# Patient Record
Sex: Male | Born: 1955 | Race: White | Hispanic: No | Marital: Married | Smoking: Never smoker
Health system: Southern US, Community
[De-identification: ages and names within clinical notes are randomized; demographics above are authoritative.]

## PROBLEM LIST (undated history)

## (undated) DIAGNOSIS — I10 Essential (primary) hypertension: Secondary | ICD-10-CM

## (undated) DIAGNOSIS — I639 Cerebral infarction, unspecified: Secondary | ICD-10-CM

## (undated) DIAGNOSIS — J129 Viral pneumonia, unspecified: Secondary | ICD-10-CM

## (undated) DIAGNOSIS — E119 Type 2 diabetes mellitus without complications: Secondary | ICD-10-CM

## (undated) DIAGNOSIS — I509 Heart failure, unspecified: Secondary | ICD-10-CM

## (undated) DIAGNOSIS — J8410 Pulmonary fibrosis, unspecified: Secondary | ICD-10-CM

## (undated) HISTORY — PX: UMBILICAL HERNIA REPAIR: SHX196

## (undated) HISTORY — DX: Heart failure, unspecified: I50.9

## (undated) HISTORY — PX: THORACIC FUSION: SHX1062

## (undated) HISTORY — DX: Essential (primary) hypertension: I10

## (undated) HISTORY — PX: CERVICAL FUSION: SHX112

## (undated) HISTORY — DX: Viral pneumonia, unspecified: J12.9

## (undated) HISTORY — PX: HERNIA REPAIR: SHX51

## (undated) HISTORY — PX: ROUX-EN-Y GASTRIC BYPASS: SHX1104

## (undated) HISTORY — DX: Cerebral infarction, unspecified: I63.9

## (undated) HISTORY — PX: OTHER SURGICAL HISTORY: SHX169

## (undated) HISTORY — PX: ROTATOR CUFF REPAIR: SHX139

## (undated) HISTORY — DX: Pulmonary fibrosis, unspecified: J84.10

## (undated) HISTORY — DX: Type 2 diabetes mellitus without complications: E11.9

---

## 2021-10-31 ENCOUNTER — Encounter (HOSPITAL_COMMUNITY): Payer: Self-pay | Admitting: Emergency Medicine

## 2021-10-31 ENCOUNTER — Other Ambulatory Visit: Payer: Self-pay

## 2021-10-31 ENCOUNTER — Other Ambulatory Visit (HOSPITAL_COMMUNITY): Payer: Self-pay

## 2021-10-31 ENCOUNTER — Ambulatory Visit (HOSPITAL_COMMUNITY)
Admission: EM | Admit: 2021-10-31 | Discharge: 2021-10-31 | Disposition: A | Discharge status: Home / Self Care | Payer: No Typology Code available for payment source | Attending: Internal Medicine | Admitting: Internal Medicine

## 2021-10-31 DIAGNOSIS — H6592 Unspecified nonsuppurative otitis media, left ear: Secondary | ICD-10-CM

## 2021-10-31 MED ORDER — OLOPATADINE HCL 0.1 % OP SOLN
1.0000 [drp] | Freq: Two times a day (BID) | OPHTHALMIC | 12 refills | Status: DC
  Filled 2021-10-31: qty 5, 25d supply, fill #0

## 2021-10-31 MED ORDER — AMOXICILLIN-POT CLAVULANATE 875-125 MG PO TABS
1.0000 | ORAL_TABLET | Freq: Two times a day (BID) | ORAL | 0 refills | Status: DC
  Filled 2021-10-31: qty 14, 7d supply, fill #0

## 2021-10-31 NOTE — ED Triage Notes (Signed)
Pt reports left ear drainage and pain x 10 days and  bilateral eye irritation. Pt states the left ear was swollen shut and he believes he has an ear infection. Pt also reports his eyes have been red at night and extremely itchy.

## 2021-10-31 NOTE — Discharge Instructions (Addendum)
Please take medications as prescribed Maintain adequate hydration If you have worsening symptoms please return to the urgent care to be reevaluated.

## 2021-10-31 NOTE — ED Provider Notes (Signed)
[facility]    CSN: [id number] Arrival date & time: 10/31/21  1627      History   Chief Complaint Chief Complaint  Patient presents with   Ear Drainage    HPI Steven Brandt is a 66 y.o. male comes to urgent care with left ear drainage and pain of 10 days duration.  Patient's symptoms have been persistent.  He also complains of itchy eyes with the matting of the eyelids every morning.  Symptoms have been ongoing for the past several days as well.  No fever or chills.  No dizziness, spinning sensation, nausea or vomiting.  No diarrhea.  Patient uses hearing aids.  Patient denies any light sensitivity.  No history of seasonal allergies.  Patient recently relocated here from [place].  HPI  History reviewed. No pertinent past medical history.  There are no problems to display for this patient.   History reviewed. No pertinent surgical history.     Home Medications    Prior to Admission medications   Medication Sig Start Date End Date Taking? Authorizing Provider  amoxicillin-clavulanate (AUGMENTIN) 875-125 MG tablet Take 1 tablet by mouth every 12 (twelve) hours. 10/31/21  Yes Avaleen Brownley, Britta Mccreedy, MD  olopatadine (PATANOL) 0.1 % ophthalmic solution Place 1 drop into both eyes 2 (two) times daily. 10/31/21  Yes Lorree Millar, Britta Mccreedy, MD    Family History History reviewed. No pertinent family history.  Social History Social History   Tobacco Use   Smoking status: Never   Smokeless tobacco: Never     Allergies   Patient has no allergy information on record.   Review of Systems Review of Systems  Constitutional: Negative.   HENT:  Positive for ear discharge and ear pain. Negative for postnasal drip, rhinorrhea, sinus pressure and sore throat.   Eyes:  Positive for discharge and redness. Negative for pain.  Respiratory: Negative.    Cardiovascular: Negative.   Neurological: Negative.     Physical Exam Triage Vital Signs ED Triage Vitals  Enc Vitals  Group     BP 10/31/21 1717 128/78     Pulse Rate 10/31/21 1717 79     Resp 10/31/21 1717 17     Temp 10/31/21 1717 98 F (36.7 C)     Temp Source 10/31/21 1717 Oral     SpO2 10/31/21 1717 98 %     Weight 10/31/21 1715 180 lb (81.6 kg)     Height 10/31/21 1715 5\' 7"  (1.702 m)     Head Circumference --      Peak Flow --      Pain Score 10/31/21 1715 5     Pain Loc --      Pain Edu? --      Excl. in GC? --    No data found.  Updated Vital Signs BP 128/78 (BP Location: Right Arm)    Pulse 79    Temp 98 F (36.7 C) (Oral)    Resp 17    Ht 5\' 7"  (1.702 m)    Wt 81.6 kg    SpO2 98%    BMI 28.19 kg/m   Visual Acuity Right Eye Distance:   Left Eye Distance:   Bilateral Distance:    Right Eye Near:   Left Eye Near:    Bilateral Near:     Physical Exam Vitals and nursing note reviewed.  Constitutional:      General: He is not in acute distress.    Appearance: He is not ill-appearing.  HENT:  Right Ear: Tympanic membrane normal.     Left Ear: Ear canal normal. There is no impacted cerumen.     Ears:     Comments: Left tympanic membrane is erythematous with purulent middle ear effusion. Cardiovascular:     Rate and Rhythm: Normal rate and regular rhythm.  Pulmonary:     Effort: Pulmonary effort is normal.     Breath sounds: Normal breath sounds.  Abdominal:     General: Bowel sounds are normal.     Palpations: Abdomen is soft.  Neurological:     Mental Status: He is alert.     UC Treatments / Results  Labs (all labs ordered are listed, but only abnormal results are displayed) Labs Reviewed - No data to display  EKG   Radiology No results found.  Procedures Procedures (including critical care time)  Medications Ordered in UC Medications - No data to display  Initial Impression / Assessment and Plan / UC Course  I have reviewed the triage vital signs and the nursing notes.  Pertinent labs & imaging results that were available during my care of the  patient were reviewed by me and considered in my medical decision making (see chart for details).     1.  Left otitis media with effusion: Amoxicillin twice daily for 7 days Ibuprofen or Tylenol as needed for pain Patanol eyedrops as needed for eye itching Use a wet rag to clean eyelids if they are matted together. Return to urgent care if symptoms persist or worsens. Final Clinical Impressions(s) / UC Diagnoses   Final diagnoses:  Left otitis media with effusion     Discharge Instructions      Please take medications as prescribed Maintain adequate hydration If you have worsening symptoms please return to the urgent care to be reevaluated.   ED Prescriptions     Medication Sig Dispense Auth. Provider   olopatadine (PATANOL) 0.1 % ophthalmic solution Place 1 drop into both eyes 2 (two) times daily. 5 mL Merrilee Jansky, MD   amoxicillin-clavulanate (AUGMENTIN) 875-125 MG tablet Take 1 tablet by mouth every 12 (twelve) hours. 14 tablet Johany Hansman, Britta Mccreedy, MD      PDMP not reviewed this encounter.   Merrilee Jansky, MD 10/31/21 [phone]

## 2021-12-03 ENCOUNTER — Emergency Department: Payer: No Typology Code available for payment source

## 2021-12-03 ENCOUNTER — Inpatient Hospital Stay
Admission: EM | Admit: 2021-12-03 | Discharge: 2021-12-05 | DRG: 287 | Disposition: A | Discharge status: Home / Self Care | Payer: No Typology Code available for payment source | Attending: Internal Medicine | Admitting: Internal Medicine

## 2021-12-03 ENCOUNTER — Other Ambulatory Visit: Payer: Self-pay

## 2021-12-03 ENCOUNTER — Observation Stay
Admit: 2021-12-03 | Discharge: 2021-12-03 | Disposition: A | Discharge status: Home / Self Care | Payer: No Typology Code available for payment source

## 2021-12-03 ENCOUNTER — Encounter: Payer: Self-pay | Admitting: Obstetrics and Gynecology

## 2021-12-03 DIAGNOSIS — R739 Hyperglycemia, unspecified: Secondary | ICD-10-CM

## 2021-12-03 DIAGNOSIS — Z79899 Other long term (current) drug therapy: Secondary | ICD-10-CM

## 2021-12-03 DIAGNOSIS — R079 Chest pain, unspecified: Secondary | ICD-10-CM | POA: Diagnosis not present

## 2021-12-03 DIAGNOSIS — F32A Depression, unspecified: Secondary | ICD-10-CM | POA: Insufficient documentation

## 2021-12-03 DIAGNOSIS — F1020 Alcohol dependence, uncomplicated: Secondary | ICD-10-CM

## 2021-12-03 DIAGNOSIS — J849 Interstitial pulmonary disease, unspecified: Secondary | ICD-10-CM

## 2021-12-03 DIAGNOSIS — H9190 Unspecified hearing loss, unspecified ear: Secondary | ICD-10-CM | POA: Insufficient documentation

## 2021-12-03 DIAGNOSIS — K219 Gastro-esophageal reflux disease without esophagitis: Secondary | ICD-10-CM | POA: Diagnosis present

## 2021-12-03 DIAGNOSIS — I10 Essential (primary) hypertension: Secondary | ICD-10-CM | POA: Diagnosis present

## 2021-12-03 DIAGNOSIS — R072 Precordial pain: Secondary | ICD-10-CM | POA: Diagnosis not present

## 2021-12-03 DIAGNOSIS — I429 Cardiomyopathy, unspecified: Secondary | ICD-10-CM

## 2021-12-03 DIAGNOSIS — F909 Attention-deficit hyperactivity disorder, unspecified type: Secondary | ICD-10-CM | POA: Diagnosis present

## 2021-12-03 DIAGNOSIS — M47812 Spondylosis without myelopathy or radiculopathy, cervical region: Secondary | ICD-10-CM | POA: Insufficient documentation

## 2021-12-03 DIAGNOSIS — I5022 Chronic systolic (congestive) heart failure: Secondary | ICD-10-CM | POA: Diagnosis present

## 2021-12-03 DIAGNOSIS — I428 Other cardiomyopathies: Secondary | ICD-10-CM | POA: Diagnosis present

## 2021-12-03 DIAGNOSIS — F329 Major depressive disorder, single episode, unspecified: Secondary | ICD-10-CM | POA: Diagnosis present

## 2021-12-03 DIAGNOSIS — Z981 Arthrodesis status: Secondary | ICD-10-CM

## 2021-12-03 DIAGNOSIS — R42 Dizziness and giddiness: Secondary | ICD-10-CM

## 2021-12-03 DIAGNOSIS — I951 Orthostatic hypotension: Secondary | ICD-10-CM | POA: Diagnosis present

## 2021-12-03 DIAGNOSIS — F17200 Nicotine dependence, unspecified, uncomplicated: Secondary | ICD-10-CM | POA: Diagnosis present

## 2021-12-03 DIAGNOSIS — E1165 Type 2 diabetes mellitus with hyperglycemia: Secondary | ICD-10-CM | POA: Diagnosis present

## 2021-12-03 DIAGNOSIS — E119 Type 2 diabetes mellitus without complications: Secondary | ICD-10-CM

## 2021-12-03 DIAGNOSIS — F4310 Post-traumatic stress disorder, unspecified: Secondary | ICD-10-CM | POA: Diagnosis present

## 2021-12-03 DIAGNOSIS — Z9884 Bariatric surgery status: Secondary | ICD-10-CM

## 2021-12-03 DIAGNOSIS — G8929 Other chronic pain: Secondary | ICD-10-CM | POA: Insufficient documentation

## 2021-12-03 DIAGNOSIS — I110 Hypertensive heart disease with heart failure: Secondary | ICD-10-CM | POA: Diagnosis present

## 2021-12-03 DIAGNOSIS — Z20822 Contact with and (suspected) exposure to covid-19: Secondary | ICD-10-CM | POA: Diagnosis present

## 2021-12-03 LAB — LIPID PANEL
Cholesterol: 192 mg/dL (ref 0–200)
HDL: 39 mg/dL — ABNORMAL LOW (ref >40)
LDL Cholesterol: 124 mg/dL — ABNORMAL HIGH (ref 0–99)
Total CHOL/HDL Ratio: 4.9 RATIO
Triglycerides: 146 mg/dL (ref <150)
VLDL: 29 mg/dL (ref 0–40)

## 2021-12-03 LAB — CBC
HCT: 45.3 % (ref 39.0–52.0)
Hemoglobin: 15.2 g/dL (ref 13.0–17.0)
MCH: 32.6 pg (ref 26.0–34.0)
MCHC: 33.6 g/dL (ref 30.0–36.0)
MCV: 97.2 fL (ref 80.0–100.0)
Platelets: 194 10*3/uL (ref 150–400)
RBC: 4.66 MIL/uL (ref 4.22–5.81)
RDW: 12.6 % (ref 11.5–15.5)
WBC: 7.2 10*3/uL (ref 4.0–10.5)
nRBC: 0 % (ref 0.0–0.2)

## 2021-12-03 LAB — TROPONIN I (HIGH SENSITIVITY)
Troponin I (High Sensitivity): 8 ng/L (ref <18)
Troponin I (High Sensitivity): 8 ng/L (ref <18)

## 2021-12-03 LAB — RESP PANEL BY RT-PCR (FLU A&B, COVID) ARPGX2
Influenza A by PCR: NEGATIVE
Influenza B by PCR: NEGATIVE
SARS Coronavirus 2 by RT PCR: NEGATIVE

## 2021-12-03 LAB — BASIC METABOLIC PANEL
Anion gap: 10 (ref 5–15)
BUN: 15 mg/dL (ref 8–23)
CO2: 26 mmol/L (ref 22–32)
Calcium: 9.1 mg/dL (ref 8.9–10.3)
Chloride: 100 mmol/L (ref 98–111)
Creatinine, Ser: 1.12 mg/dL (ref 0.61–1.24)
GFR, Estimated: >60 mL/min (ref >60)
Glucose, Bld: 196 mg/dL — ABNORMAL HIGH (ref 70–99)
Potassium: 3.8 mmol/L (ref 3.5–5.1)
Sodium: 136 mmol/L (ref 135–145)

## 2021-12-03 LAB — HEPATIC FUNCTION PANEL
ALT: 26 U/L (ref 0–44)
AST: 21 U/L (ref 15–41)
Albumin: 3.9 g/dL (ref 3.5–5.0)
Alkaline Phosphatase: 82 U/L (ref 38–126)
Bilirubin, Direct: 0.1 mg/dL (ref 0.0–0.2)
Indirect Bilirubin: 0.8 mg/dL (ref 0.3–0.9)
Total Bilirubin: 0.9 mg/dL (ref 0.3–1.2)
Total Protein: 6.8 g/dL (ref 6.5–8.1)

## 2021-12-03 LAB — HEPARIN LEVEL (UNFRACTIONATED): Heparin Unfractionated: 0.24 IU/mL — ABNORMAL LOW (ref 0.30–0.70)

## 2021-12-03 LAB — BRAIN NATRIURETIC PEPTIDE: B Natriuretic Peptide: 19.1 pg/mL (ref 0.0–100.0)

## 2021-12-03 LAB — PROTIME-INR
INR: 1.1 (ref 0.8–1.2)
Prothrombin Time: 13.8 seconds (ref 11.4–15.2)

## 2021-12-03 LAB — APTT: aPTT: 32 seconds (ref 24–36)

## 2021-12-03 LAB — MAGNESIUM: Magnesium: 1.9 mg/dL (ref 1.7–2.4)

## 2021-12-03 MED ORDER — ACETAMINOPHEN 500 MG PO TABS
1000.0000 mg | ORAL_TABLET | Freq: Once | ORAL | Status: AC
  Administered 2021-12-03: 1000 mg via ORAL
  Filled 2021-12-03: qty 2

## 2021-12-03 MED ORDER — FUROSEMIDE 20 MG PO TABS
20.0000 mg | ORAL_TABLET | Freq: Every day | ORAL | Status: DC
  Administered 2021-12-04 – 2021-12-05 (×2): 20 mg via ORAL
  Filled 2021-12-03 (×2): qty 1

## 2021-12-03 MED ORDER — SPIRONOLACTONE 25 MG PO TABS
25.0000 mg | ORAL_TABLET | Freq: Every day | ORAL | Status: DC
Start: 2021-12-04 — End: 2021-12-05
  Administered 2021-12-04 – 2021-12-05 (×2): 25 mg via ORAL
  Filled 2021-12-03 (×2): qty 1

## 2021-12-03 MED ORDER — HEPARIN BOLUS VIA INFUSION
1000.0000 [IU] | Freq: Once | INTRAVENOUS | Status: AC
Start: 2021-12-03 — End: 2021-12-03
  Administered 2021-12-03: 1000 [IU] via INTRAVENOUS
  Filled 2021-12-03: qty 1000

## 2021-12-03 MED ORDER — IOHEXOL 350 MG/ML SOLN
75.0000 mL | Freq: Once | INTRAVENOUS | Status: AC | PRN
  Administered 2021-12-03: 75 mL via INTRAVENOUS

## 2021-12-03 MED ORDER — SODIUM CHLORIDE 0.9% FLUSH
3.0000 mL | INTRAVENOUS | Status: DC | PRN
Start: 2021-12-03 — End: 2021-12-05

## 2021-12-03 MED ORDER — HEPARIN (PORCINE) 25000 UT/250ML-% IV SOLN
1150.0000 [IU]/h | INTRAVENOUS | Status: DC
  Administered 2021-12-03: 1000 [IU]/h via INTRAVENOUS
  Filled 2021-12-03: qty 250

## 2021-12-03 MED ORDER — BUPROPION HCL ER (XL) 150 MG PO TB24
150.0000 mg | ORAL_TABLET | Freq: Every day | ORAL | Status: DC
  Administered 2021-12-04 – 2021-12-05 (×2): 150 mg via ORAL
  Filled 2021-12-03 (×3): qty 1

## 2021-12-03 MED ORDER — ACETAMINOPHEN 325 MG PO TABS: 650.0000 mg | ORAL_TABLET | Freq: Four times a day (QID) | ORAL | Status: DC | PRN

## 2021-12-03 MED ORDER — SACUBITRIL-VALSARTAN 49-51 MG PO TABS: 1.0000 | ORAL_TABLET | Freq: Two times a day (BID) | ORAL | Status: DC

## 2021-12-03 MED ORDER — ASPIRIN 81 MG PO CHEW
324.0000 mg | CHEWABLE_TABLET | Freq: Once | ORAL | Status: AC
Start: 2021-12-03 — End: 2021-12-03
  Administered 2021-12-03: 324 mg via ORAL
  Filled 2021-12-03: qty 4

## 2021-12-03 MED ORDER — NITROGLYCERIN 0.4 MG SL SUBL
0.4000 mg | SUBLINGUAL_TABLET | SUBLINGUAL | Status: DC | PRN
  Administered 2021-12-04: 0.4 mg via SUBLINGUAL
  Filled 2021-12-03: qty 1

## 2021-12-03 MED ORDER — SODIUM CHLORIDE 0.9 % IV SOLN: 250.0000 mL | INTRAVENOUS | Status: DC | PRN

## 2021-12-03 MED ORDER — HEPARIN BOLUS VIA INFUSION
4000.0000 [IU] | Freq: Once | INTRAVENOUS | Status: AC
  Administered 2021-12-03: 4000 [IU] via INTRAVENOUS
  Filled 2021-12-03: qty 4000

## 2021-12-03 MED ORDER — SODIUM CHLORIDE 0.9% FLUSH: 3.0000 mL | Freq: Two times a day (BID) | INTRAVENOUS | Status: DC

## 2021-12-03 MED ORDER — MIRTAZAPINE 15 MG PO TABS
15.0000 mg | ORAL_TABLET | Freq: Every day | ORAL | Status: DC
  Administered 2021-12-03 – 2021-12-04 (×2): 15 mg via ORAL
  Filled 2021-12-03 (×2): qty 1

## 2021-12-03 MED ORDER — ACETAMINOPHEN 650 MG RE SUPP: 650.0000 mg | Freq: Four times a day (QID) | RECTAL | Status: DC | PRN

## 2021-12-03 MED ORDER — CARVEDILOL 12.5 MG PO TABS
12.5000 mg | ORAL_TABLET | Freq: Two times a day (BID) | ORAL | Status: DC
Start: 2021-12-03 — End: 2021-12-05
  Administered 2021-12-03 – 2021-12-04 (×3): 12.5 mg via ORAL
  Filled 2021-12-03 (×3): qty 1

## 2021-12-03 MED ORDER — ISOSORBIDE MONONITRATE ER 30 MG PO TB24
30.0000 mg | ORAL_TABLET | Freq: Every day | ORAL | Status: DC
  Administered 2021-12-03 – 2021-12-04 (×2): 30 mg via ORAL
  Filled 2021-12-03 (×3): qty 1

## 2021-12-03 NOTE — Progress Notes (Signed)
Admission profile updated. ?

## 2021-12-03 NOTE — ED Triage Notes (Signed)
Pt states that he has had chest pain for the last week, but today it woke him up from sleep. Pain is on the left side and goes down his left arm, shoulder and back, with SHOB. Denies any N/V or diaphoresis  ?

## 2021-12-03 NOTE — ED Notes (Signed)
Patient transported to X-ray 

## 2021-12-03 NOTE — Consult Note (Signed)
Steven Brandt is a 66 y.o. male  [id number] ? ?Primary Cardiologist: Adrian Blackwater, MD ?Reason for Consultation: chest pain ? ?HPI: Steven Brandt is a 66 y.o. male with medical history significant for non-ischemic cardiomyopathy, CHF, HTN, alcohol abuse, gastric bypass, who presents with ongoing chest pain. ?  ?Denies a history of chest pain. Denies history ischemic heart disease. Has had a cardiac cath though that was several years ago. Says most recent echo from 1-2 years ago showed preserved EF though says his EF was previously low and he wore a life vest for some time. Denies a history of MI. Says he has had a week of left shoulder/chest pressure that does not radiate. Comes and goes, not exertional, unclear what triggers it, nothing seems to relieve it. Also mild shortness of breath. No fevers or cough. No abdominal pain, no nausea/vomiting/diarrhea. No swelling.  ? ? ?Review of Systems: Ongoing, intermittent chest pain. Reports he has never had this pain before. Shortness of breath on exertion. ? ? ?No past medical history on file. ? ?(Not in a hospital admission) ? ? ? ? ? ?Infusions: ? heparin 1,000 Units/hr (12/03/21 1218)  ? ? ?Allergies  ?Allergen Reactions  ? Biotene Dry Mouth [Antiseptic Mouth Rinse] Anaphylaxis  ? Celecoxib   ? Gabapentin   ?  Other reaction(s): Drowsy  ? Molds & Smuts   ?  Other reaction(s): Allergic rhinitis  ? Pregabalin   ?  Other reaction(s): Depressive disorder  ? ? ?Social History  ? ?Socioeconomic History  ? Marital status: Married  ?  Spouse name: Not on file  ? Number of children: Not on file  ? Years of education: Not on file  ? Highest education level: Not on file  ?Occupational History  ? Not on file  ?Tobacco Use  ? Smoking status: Never  ? Smokeless tobacco: Never  ?Substance and Sexual Activity  ? Alcohol use: Not on file  ? Drug use: Not on file  ? Sexual activity: Not on file  ?Other Topics Concern  ? Not on file  ?Social History Narrative  ? Not on file   ? ?Social Determinants of Health  ? ?Financial Resource Strain: Not on file  ?Food Insecurity: Not on file  ?Transportation Needs: Not on file  ?Physical Activity: Not on file  ?Stress: Not on file  ?Social Connections: Not on file  ?Intimate Partner Violence: Not on file  ? ? ?No family history on file. ? ?PHYSICAL EXAM: ?Vitals:  ? 12/03/21 1030 12/03/21 1100  ?BP: 115/79 128/83  ?Pulse: (!) 55 62  ?Resp: 16 (!) 21  ?Temp:    ?SpO2: 96% 95%  ? ? ?No intake or output data in the 24 hours ending 12/03/21 1256 ? ?General:  Well appearing. No respiratory difficulty ?HEENT: normal ?Neck: supple. no JVD. Carotids 2+ bilat; no bruits. No lymphadenopathy or thryomegaly appreciated. ?Cor: PMI nondisplaced. Regular rate & rhythm. No rubs, gallops or murmurs. ?Lungs: clear ?Abdomen: soft, nontender, nondistended. No hepatosplenomegaly. No bruits or masses. Good bowel sounds. ?Extremities: no cyanosis, clubbing, rash, edema ?Neuro: alert & oriented x 3, cranial nerves grossly intact. moves all 4 extremities w/o difficulty. Affect pleasant. ? ?ECG: NSR, HR 88, non-specific ST changes ? ?Results for orders placed or performed during the hospital encounter of 12/03/21 (from the past 24 hour(s))  ?Basic metabolic panel     Status: Abnormal  ? Collection Time: 12/03/21  8:37 AM  ?Result Value Ref Range  ? Sodium 136 135 - 145 mmol/L  ?  Potassium 3.8 3.5 - 5.1 mmol/L  ? Chloride 100 98 - 111 mmol/L  ? CO2 26 22 - 32 mmol/L  ? Glucose, Bld 196 (H) 70 - 99 mg/dL  ? BUN 15 8 - 23 mg/dL  ? Creatinine, Ser 1.12 0.61 - 1.24 mg/dL  ? Calcium 9.1 8.9 - 10.3 mg/dL  ? GFR, Estimated >60 >60 mL/min  ? Anion gap 10 5 - 15  ?CBC     Status: None  ? Collection Time: 12/03/21  8:37 AM  ?Result Value Ref Range  ? WBC 7.2 4.0 - 10.5 K/uL  ? RBC 4.66 4.22 - 5.81 MIL/uL  ? Hemoglobin 15.2 13.0 - 17.0 g/dL  ? HCT 45.3 39.0 - 52.0 %  ? MCV 97.2 80.0 - 100.0 fL  ? MCH 32.6 26.0 - 34.0 pg  ? MCHC 33.6 30.0 - 36.0 g/dL  ? RDW 12.6 11.5 - 15.5 %  ?  Platelets 194 150 - 400 K/uL  ? nRBC 0.0 0.0 - 0.2 %  ?Troponin I (High Sensitivity)     Status: None  ? Collection Time: 12/03/21  8:37 AM  ?Result Value Ref Range  ? Troponin I (High Sensitivity) 8 <18 ng/L  ?Brain natriuretic peptide     Status: None  ? Collection Time: 12/03/21  8:37 AM  ?Result Value Ref Range  ? B Natriuretic Peptide 19.1 0.0 - 100.0 pg/mL  ?Magnesium     Status: None  ? Collection Time: 12/03/21  8:37 AM  ?Result Value Ref Range  ? Magnesium 1.9 1.7 - 2.4 mg/dL  ?Hepatic function panel     Status: None  ? Collection Time: 12/03/21  8:37 AM  ?Result Value Ref Range  ? Total Protein 6.8 6.5 - 8.1 g/dL  ? Albumin 3.9 3.5 - 5.0 g/dL  ? AST 21 15 - 41 U/L  ? ALT 26 0 - 44 U/L  ? Alkaline Phosphatase 82 38 - 126 U/L  ? Total Bilirubin 0.9 0.3 - 1.2 mg/dL  ? Bilirubin, Direct 0.1 0.0 - 0.2 mg/dL  ? Indirect Bilirubin 0.8 0.3 - 0.9 mg/dL  ?Lipid panel     Status: Abnormal  ? Collection Time: 12/03/21  8:37 AM  ?Result Value Ref Range  ? Cholesterol 192 0 - 200 mg/dL  ? Triglycerides 146 <150 mg/dL  ? HDL 39 (L) >40 mg/dL  ? Total CHOL/HDL Ratio 4.9 RATIO  ? VLDL 29 0 - 40 mg/dL  ? LDL Cholesterol 124 (H) 0 - 99 mg/dL  ?Protime-INR     Status: None  ? Collection Time: 12/03/21  8:38 AM  ?Result Value Ref Range  ? Prothrombin Time 13.8 11.4 - 15.2 seconds  ? INR 1.1 0.8 - 1.2  ?APTT     Status: None  ? Collection Time: 12/03/21  8:38 AM  ?Result Value Ref Range  ? aPTT 32 24 - 36 seconds  ?Resp Panel by RT-PCR (Flu A&B, Covid) Nasopharyngeal Swab     Status: None  ? Collection Time: 12/03/21  8:58 AM  ? Specimen: Nasopharyngeal Swab; Nasopharyngeal(NP) swabs in vial transport medium  ?Result Value Ref Range  ? SARS Coronavirus 2 by RT PCR NEGATIVE NEGATIVE  ? Influenza A by PCR NEGATIVE NEGATIVE  ? Influenza B by PCR NEGATIVE NEGATIVE  ?Troponin I (High Sensitivity)     Status: None  ? Collection Time: 12/03/21 10:23 AM  ?Result Value Ref Range  ? Troponin I (High Sensitivity) 8 <18 ng/L  ? ?DG Chest 2  View ? ?  Result Date: 12/03/2021 ?CLINICAL DATA:  Chest pain EXAM: CHEST - 2 VIEW COMPARISON:  None. FINDINGS: Normal heart size. Mild tortuosity of thoracic aorta, likely age related. Focal pleural thickening at the right costophrenic angle. Mild elevation of the right hemidiaphragm. Lungs are clear. No pleural effusion or pneumothorax. IMPRESSION: Focal pleural thickening of the right costophrenic angle. Recommend comparison with outside priors if available to confirm stability. If no priors are available, recommend chest CT with contrast for further evaluation. Electronically Signed   By: Allegra Lai M.D.   On: 12/03/2021 08:56  ? ?CT Angio Chest PE W and/or Wo Contrast ? ?Result Date: 12/03/2021 ?CLINICAL DATA:  Chest pain. EXAM: CT ANGIOGRAPHY CHEST WITH CONTRAST TECHNIQUE: Multidetector CT imaging of the chest was performed using the standard protocol during bolus administration of intravenous contrast. Multiplanar CT image reconstructions and MIPs were obtained to evaluate the vascular anatomy. RADIATION DOSE REDUCTION: This exam was performed according to the departmental dose-optimization program which includes automated exposure control, adjustment of the mA and/or kV according to patient size and/or use of iterative reconstruction technique. CONTRAST:  52mL OMNIPAQUE IOHEXOL 350 MG/ML SOLN COMPARISON:  Radiograph of same day. FINDINGS: Cardiovascular: Satisfactory opacification of the pulmonary arteries to the segmental level. No evidence of pulmonary embolism. Normal heart size. No pericardial effusion. Mediastinum/Nodes: Thyroid gland is unremarkable. No adenopathy is noted. Moderate size sliding-type hiatal hernia is noted with postsurgical changes present. Lungs/Pleura: No pneumothorax or pleural effusion is noted. Small focus of scarring seen in right lung apex as well as laterally in the right lung base. No acute pulmonary disease is noted. Upper Abdomen: No acute abnormality. Musculoskeletal: No  chest wall abnormality. No acute or significant osseous findings. Review of the MIP images confirms the above findings. IMPRESSION: No definite evidence of pulmonary embolus. Moderate size sliding-type hiatal herni

## 2021-12-03 NOTE — H&P (Signed)
?History and Physical  ? ? ?Steven Brandt VZD:[id number] DOB: 1956-03-20 DOA: 12/03/2021 ? ?[facility] ?Patient coming from: home ? ? ?Chief Complaint: chest pain ? ?HPI: Steven Brandt is a 66 y.o. male with medical history significant for non-ischemic cardiomyopathy, ILD, htn, alcohol abuse, gastric bypass, who presents with the above. ? ?Denies a history of chest pain. Denies history ischemic heart disease. Has had a cardiac cath though that was several years ago. Says most recent echo from 1-2 years ago showed preserved EF though says his EF was previously low and he worse a life vest for some time. Denies a history of MI. Says he has had a week of left shoulder/chest pressure that does not radiate. Comes and goes, not exertional, unclear what triggers it, nothing seems to relieve it. Also mild shortness of breath. No fevers or cough. No abdominal pain, no nausea/vomiting/diarrhea. No swelling.  ? ?ED Course:  ? ?Labs, heparin, cardiology consult ? ?Review of Systems: As per HPI otherwise 10 point review of systems negative.  ? ? ?No past medical history on file. ? ?No past surgical history on file. ? ? reports that he has never smoked. He has never used smokeless tobacco. No history on file for alcohol use and drug use. ? ?Allergies  ?Allergen Reactions  ? Biotene Dry Mouth [Antiseptic Mouth Rinse] Anaphylaxis  ? Celecoxib   ? Gabapentin   ?  Other reaction(s): Drowsy  ? Molds & Smuts   ?  Other reaction(s): Allergic rhinitis  ? Pregabalin   ?  Other reaction(s): Depressive disorder  ? ? ?No family history on file. ? ?Prior to Admission medications   ?Medication Sig Start Date End Date Taking? Authorizing Provider  ?Ascorbic Acid (VITAMIN C) 1000 MG tablet Take 1,000 mg by mouth daily.   Yes [provider]  ?buPROPion HCl ER, XL, 450 MG TB24 Take 150 mg by mouth daily.   Yes [provider]  ?carvedilol (COREG) 12.5 MG tablet Take 12.5 mg by mouth 2 (two) times daily with a meal.   Yes  [provider]  ?furosemide (LASIX) 20 MG tablet Take 20 mg by mouth.   Yes [provider]  ?mirtazapine (REMERON) 15 MG tablet Take 15 mg by mouth at bedtime.   Yes [provider]  ?sacubitril-valsartan (ENTRESTO) 49-51 MG Take 1 tablet by mouth 2 (two) times daily.   Yes [provider]  ?spironolactone (ALDACTONE) 25 MG tablet Take 25 mg by mouth daily.   Yes [provider]  ?vitamin B-12 (CYANOCOBALAMIN) 500 MCG tablet Take 500 mcg by mouth daily.   Yes [provider]  ? ? ?Physical Exam: ?Vitals:  ? 12/03/21 0856 12/03/21 0900 12/03/21 1030 12/03/21 1100  ?BP: 113/73 115/77 115/79 128/83  ?Pulse: 74 75 (!) 55 62  ?Resp: 15 13 16  (!) 21  ?Temp:      ?SpO2: 93% 97% 96% 95%  ?Weight:      ?Height:      ? ? ?Constitutional: No acute distress ?Head: Atraumatic ?Eyes: Conjunctiva clear ?ENM: Moist mucous membranes. Normal dentition.  ?Neck: Supple ?Respiratory: Clear to auscultation bilaterally, no wheezing/rales/rhonchi. Normal respiratory effort. No accessory muscle use. ?Cardiovascular: Regular rate and rhythm. No murmurs/rubs/gallops. ?Abdomen: Non-tender, non-distended. No masses. No rebound or guarding. Positive bowel sounds. ?Musculoskeletal: No joint deformity upper and lower extremities. Normal ROM, no contractures. Normal muscle tone.  ?Skin: No rashes, lesions, or ulcers.  ?Extremities: No peripheral edema. Palpable peripheral pulses. ?Neurologic: Alert, moving all 4  extremities. ?Psychiatric: Normal insight and judgement. ? ? ?Labs on Admission: I have personally reviewed following labs and imaging studies ? ?CBC: ?Recent Labs  ?Lab 12/03/21 ?5790  ?WBC 7.2  ?HGB 15.2  ?HCT 45.3  ?MCV 97.2  ?PLT 194  ? ?Basic Metabolic Panel: ?Recent Labs  ?Lab 12/03/21 ?3833  ?NA 136  ?K 3.8  ?CL 100  ?CO2 26  ?GLUCOSE 196*  ?BUN 15  ?CREATININE 1.12  ?CALCIUM 9.1  ?MG 1.9  ? ?GFR: ?Estimated Creatinine Clearance: 68.3 mL/min (by C-G formula based on SCr of  1.12 mg/dL). ?Liver Function Tests: ?Recent Labs  ?Lab 12/03/21 ?3832  ?AST 21  ?ALT 26  ?ALKPHOS 82  ?BILITOT 0.9  ?PROT 6.8  ?ALBUMIN 3.9  ? ?No results for input(s): LIPASE, AMYLASE in the last 168 hours. ?No results for input(s): AMMONIA in the last 168 hours. ?Coagulation Profile: ?Recent Labs  ?Lab 12/03/21 ?9191  ?INR 1.1  ? ?Cardiac Enzymes: ?No results for input(s): CKTOTAL, CKMB, CKMBINDEX, TROPONINI in the last 168 hours. ?BNP (last 3 results) ?No results for input(s): PROBNP in the last 8760 hours. ?HbA1C: ?No results for input(s): HGBA1C in the last 72 hours. ?CBG: ?No results for input(s): GLUCAP in the last 168 hours. ?Lipid Profile: ?Recent Labs  ?  12/03/21 ?6606  ?CHOL 192  ?HDL 39*  ?LDLCALC 124*  ?TRIG 146  ?CHOLHDL 4.9  ? ?Thyroid Function Tests: ?No results for input(s): TSH, T4TOTAL, FREET4, T3FREE, THYROIDAB in the last 72 hours. ?Anemia Panel: ?No results for input(s): VITAMINB12, FOLATE, FERRITIN, TIBC, IRON, RETICCTPCT in the last 72 hours. ?Urine analysis: ?No results found for: COLORURINE, APPEARANCEUR, LABSPEC, PHURINE, GLUCOSEU, HGBUR, BILIRUBINUR, KETONESUR, PROTEINUR, UROBILINOGEN, NITRITE, LEUKOCYTESUR ? ?Radiological Exams on Admission: ?DG Chest 2 View ? ?Result Date: 12/03/2021 ?CLINICAL DATA:  Chest pain EXAM: CHEST - 2 VIEW COMPARISON:  None. FINDINGS: Normal heart size. Mild tortuosity of thoracic aorta, likely age related. Focal pleural thickening at the right costophrenic angle. Mild elevation of the right hemidiaphragm. Lungs are clear. No pleural effusion or pneumothorax. IMPRESSION: Focal pleural thickening of the right costophrenic angle. Recommend comparison with outside priors if available to confirm stability. If no priors are available, recommend chest CT with contrast for further evaluation. Electronically Signed   By: Allegra Lai M.D.   On: 12/03/2021 08:56  ? ?CT Angio Chest PE W and/or Wo Contrast ? ?Result Date: 12/03/2021 ?CLINICAL DATA:  Chest pain. EXAM:  CT ANGIOGRAPHY CHEST WITH CONTRAST TECHNIQUE: Multidetector CT imaging of the chest was performed using the standard protocol during bolus administration of intravenous contrast. Multiplanar CT image reconstructions and MIPs were obtained to evaluate the vascular anatomy. RADIATION DOSE REDUCTION: This exam was performed according to the departmental dose-optimization program which includes automated exposure control, adjustment of the mA and/or kV according to patient size and/or use of iterative reconstruction technique. CONTRAST:  52mL OMNIPAQUE IOHEXOL 350 MG/ML SOLN COMPARISON:  Radiograph of same day. FINDINGS: Cardiovascular: Satisfactory opacification of the pulmonary arteries to the segmental level. No evidence of pulmonary embolism. Normal heart size. No pericardial effusion. Mediastinum/Nodes: Thyroid gland is unremarkable. No adenopathy is noted. Moderate size sliding-type hiatal hernia is noted with postsurgical changes present. Lungs/Pleura: No pneumothorax or pleural effusion is noted. Small focus of scarring seen in right lung apex as well as laterally in the right lung base. No acute pulmonary disease is noted. Upper Abdomen: No acute abnormality. Musculoskeletal: No chest wall abnormality. No acute or significant osseous findings. Review of the MIP images confirms  the above findings. IMPRESSION: No definite evidence of pulmonary embolus. Moderate size sliding-type hiatal hernia is noted with postsurgical changes. Electronically Signed   By: Lupita Raider M.D.   On: 12/03/2021 09:52   ? ?EKG: Independently reviewed. NSR ? ?Assessment/Plan ?Principal Problem: ?  Chest pain ?Active Problems: ?  Post-traumatic stress disorder, unspecified ?  Attention deficit hyperactivity disorder ?  Cardiomyopathy (HCC) ?  Chronic systolic heart failure (HCC) ?  Essential hypertension ?  Gastroesophageal reflux disease ?  Alcoholism (HCC) ?  History of Roux-en-Y gastric bypass ?  History of spinal fusion ?  ? ?#  Chest pain ?Etiology unclear. This could represent unstable angina though patient does not have a history of ischemic heart disease. Does not appear to be in heart failure. CTA without acute findings. No TTP to su

## 2021-12-03 NOTE — ED Notes (Signed)
ED Provider at bedside. 

## 2021-12-03 NOTE — Progress Notes (Signed)
ANTICOAGULATION CONSULT NOTE ? ?Pharmacy Consult for heparin infusion initiation &  monitoring ?Indication: chest pain/ACS ? ?Patient Measurements: ?Height: 5\' 7"  (170.2 cm) ?Weight: 82 kg (180 lb 12.4 oz) ?IBW/kg (Calculated) : 66.1 ?Heparin Dosing Weight: 82 kg ? ?Vital Signs: ?Temp: 98.4 ?F (36.9 ?C) (03/28 0827) ?BP: 128/83 (03/28 1100) ?Pulse Rate: 62 (03/28 1100) ? ?Labs: ?Recent Labs  ?  12/03/21 ?[id number] 12/03/21 ?1023  ?HGB 15.2  --   ?HCT 45.3  --   ?PLT 194  --   ?CREATININE 1.12  --   ?TROPONINIHS 8 8  ? ? ?Estimated Creatinine Clearance: 68.3 mL/min (by C-G formula based on SCr of 1.12 mg/dL). ? ?Assessment:  66 y.o. male w/ PMH of gastric bypass, cardiomyopathy, anxiety, chronic back pain, HTN, DM, GERD, and interstitial lung disease who presents for evaluation of chest pain.  Pharmacy is asked to initiate heparin for ACS. He is on no chronic anticoagulation according to Med Rec. ? ?Goal of Therapy:  ?Heparin level 0.3-0.7 units/ml ?Monitor platelets by anticoagulation protocol: Yes ?  ?Plan:  ?Give 4000 units bolus x 1 ?Start heparin infusion at 1000 units/hr ?Check anti-Xa level in 6 hours and daily while on heparin ?Continue to monitor H&H and platelets ? ?Dallie Piles ?12/03/2021,11:58 AM ? ? ?

## 2021-12-03 NOTE — Progress Notes (Signed)
ANTICOAGULATION CONSULT NOTE ? ?Pharmacy Consult for heparin infusion initiation & monitoring ?Indication: chest pain/ACS ? ?Patient Measurements: ?Height: 5\' 7"  (170.2 cm) ?Weight: 81.9 kg (180 lb 8 oz) ?IBW/kg (Calculated) : 66.1 ?Heparin Dosing Weight: 82 kg ? ? ?Labs: ?Recent Labs  ?  12/03/21 ?12/05/21 12/03/21 ?12/05/21 12/03/21 ?1023 12/03/21 ?1908  ?HGB 15.2  --   --   --   ?HCT 45.3  --   --   --   ?PLT 194  --   --   --   ?APTT  --  32  --   --   ?LABPROT  --  13.8  --   --   ?INR  --  1.1  --   --   ?HEPARINUNFRC  --   --   --  0.24*  ?CREATININE 1.12  --   --   --   ?TROPONINIHS 8  --  8  --   ? ? ?Estimated Creatinine Clearance: 68.2 mL/min (by C-G formula based on SCr of 1.12 mg/dL). ? ?Assessment:  66 y.o. male w/ PMH of gastric bypass, cardiomyopathy, anxiety, chronic back pain, HTN, DM, GERD, and interstitial lung disease who presents for evaluation of chest pain.  Pharmacy is asked to initiate heparin for ACS. He is on no chronic anticoagulation according to Med Rec. ? ?Goal of Therapy:  ?Heparin level 0.3-0.7 units/ml ?Monitor platelets by anticoagulation protocol: Yes ?  ?Plan:  ?--Heparin level is subtherapeutic ?--Heparin 1000 unit IV bolus and increase heparin infusion to 1150 units/hr ?--Re-check HL in 6 hours ?--Daily CBC per protocol while on IV heparin ? ?77 ?12/03/2021,7:43 PM ? ? ?

## 2021-12-03 NOTE — ED Notes (Signed)
Patient transported to CT 

## 2021-12-03 NOTE — ED Notes (Signed)
Blue top sent to lab if needed. 

## 2021-12-03 NOTE — ED Provider Notes (Signed)
? ?[facility] ?Provider Note ? ? ? Event Date/Time  ? First MD Initiated Contact with Patient 12/03/21 [phone]   ?  (approximate) ? ? ?History  ? ?Chest Pain ? ? ?HPI ? ?Steven Brandt is a 66 y.o. male with past medical history of gastric bypass, cardiomyopathy on Entresto and Lasix, anxiety, chronic back pain, HTN, DM, GERD, and interstitial lung disease who presents for evaluation of chest pain.  Patient states he noticed it about a week ago that it seemed to only be intermittent, going but starting this morning was more persistent.  States he did not have pain yesterday and thinks he may have had pain the day before but is not exactly sure but it was never as bad as it is today.  States he generally feels overall weak but does not any focal weakness.  He describes it as pressure rating towards his left arm and his back.  No associated nausea, vomiting, diarrhea, Donnell pain, headache, earache, sore throat, cough, fevers or urinary symptoms.  He denies any current EtOH use, illicit drug use or tobacco abuse.  Denies any known history of coronary disease although states that he had fairly advanced heart failure that he thinks has gotten better.  He notes he recently moved to the area in December from [place] and has not established any local care including with PCP or cardiology.  No recent medication changes.  Denies any other acute concerns at this time.  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 ? ?  ? ? ?Physical Exam  ?Triage Vital Signs: ?ED Triage Vitals  ?Enc Vitals Group  ?   BP 12/03/21 0827 131/86  ?   Pulse Rate 12/03/21 0827 89  ?   Resp 12/03/21 0827 20  ?   Temp 12/03/21 0827 98.4 ?F (36.9 ?C)  ?   Temp src --   ?   SpO2 12/03/21 0827 98 %  ?   Weight 12/03/21 0829 180 lb 12.4 oz (82 kg)  ?   Height 12/03/21 0829 5\' 7"  (1.702 m)  ?   Head Circumference --   ?   Peak Flow --   ?   Pain Score 12/03/21  0829 7  ?   Pain Loc --   ?   Pain Edu? --   ?   Excl. in GC? --   ? ? ?Most recent vital signs: ?Vitals:  ? 12/03/21 1030 12/03/21 1100  ?BP: 115/79 128/83  ?Pulse: (!) 55 62  ?Resp: 16 (!) 21  ?Temp:    ?SpO2: 96% 95%  ? ? ?General: Awake, no distress.  ?CV:  Good peripheral perfusion.  2+ radial pulses.  No significant murmur. ?Resp:  Normal effort.  Clear bilaterally ?Abd:  No distention.  Soft throughout. ?Other:  No significant lower extremity edema. ? ? ?ED Results / Procedures / Treatments  ?Labs ?(all labs ordered are listed, but only abnormal results are displayed) ?Labs Reviewed  ?BASIC METABOLIC PANEL - Abnormal; Notable for the following components:  ?    Result Value  ? Glucose, Bld 196 (*)   ? All other components within normal limits  ?RESP PANEL BY RT-PCR (FLU A&B, COVID) ARPGX2  ?CBC  ?BRAIN NATRIURETIC PEPTIDE  ?MAGNESIUM  ?HEPATIC FUNCTION PANEL  ?HEMOGLOBIN A1C  ?PROTIME-INR  ?APTT  ?LIPID PANEL  ?TROPONIN I (HIGH SENSITIVITY)  ?TROPONIN I (HIGH SENSITIVITY)  ? ? ? ?EKG ? ?ECG  is remarkable for sinus rhythm with a ventricular rate of 88, left anterior fascicle block, Q waves in V1, left axis deviation without other clear evidence of acute ischemia or significant arrhythmia. ? ? ?RADIOLOGY ? ?Chest x-ray my interpretation without clear focal consolidation, effusion, edema, pneumothorax or other clear acute process.  I also reviewed radiology interpretation and agree with their findings of focal pleural thickening in the right costophrenic angle and recommendation to follow this up with a CT to further characterize this there is currently no other outside imaging available for comparison. ? ?CTA chest interpreted by myself without evidence of large PE, pneumonia, pneumothorax or other clear acute thoracic process.  I also reviewed radiology's interpretation and agree with the findings of moderate-sized sliding hiatal hernia with some postsurgical changes without any other acute  process. ? ? ?PROCEDURES: ? ?Critical Care performed: Yes, see critical care procedure note(s) ? ?.1-3 Lead EKG Interpretation ?Performed by: Gilles Chiquito, MD ?Authorized by: Gilles Chiquito, MD  ? ?  Interpretation: non-specific   ?  ECG rate assessment: normal   ?  Rhythm: sinus rhythm   ?  Ectopy: none   ?  Conduction: abnormal   ?.Critical Care ?Performed by: Gilles Chiquito, MD ?Authorized by: Gilles Chiquito, MD  ? ?Critical care provider statement:  ?  Critical care time (minutes):  30 ?  Critical care was necessary to treat or prevent imminent or life-threatening deterioration of the following conditions:  Cardiac failure ?  Critical care was time spent personally by me on the following activities:  Development of treatment plan with patient or surrogate, discussions with consultants, evaluation of patient's response to treatment, examination of patient, ordering and review of laboratory studies, ordering and review of radiographic studies, ordering and performing treatments and interventions, pulse oximetry, re-evaluation of patient's condition and review of old charts ? ?The patient is on the cardiac monitor to evaluate for evidence of arrhythmia and/or significant heart rate changes. ? ? ?MEDICATIONS ORDERED IN ED: ?Medications  ?nitroGLYCERIN (NITROSTAT) SL tablet 0.4 mg (has no administration in time range)  ?heparin bolus via infusion 4,000 Units (has no administration in time range)  ?heparin ADULT infusion 100 units/mL (25000 units/257mL) (has no administration in time range)  ?aspirin chewable tablet 324 mg (324 mg Oral Given 12/03/21 0856)  ?iohexol (OMNIPAQUE) 350 MG/ML injection 75 mL (75 mLs Intravenous Contrast Given 12/03/21 0926)  ?acetaminophen (TYLENOL) tablet 1,000 mg (1,000 mg Oral Given 12/03/21 1042)  ? ? ? ?IMPRESSION / MDM / ASSESSMENT AND PLAN / ED COURSE  ?I reviewed the triage vital signs and the nursing notes. ?             ?               ? ?Differential diagnosis includes,  but is not limited to ACS, esophageal spasm, pneumonia, pneumothorax, bronchitis, arrhythmia, anemia and metabolic derangements with lower suspicion at this time for dissection given description of symptoms.  ? ?ECG is remarkable for sinus rhythm with a ventricular rate of 88, left anterior fascicle block, Q waves in V1, left axis deviation without other clear evidence of acute ischemia or significant arrhythmia. ? ?Chest x-ray my interpretation without clear focal consolidation, effusion, edema, pneumothorax or other clear acute process.  I also reviewed radiology interpretation and agree with their findings of focal pleural thickening in the right costophrenic angle and recommendation to follow this up with a CT to further characterize this there is currently no other  outside imaging available for comparison. ? ?CTA chest interpreted by myself without evidence of large PE, pneumonia, pneumothorax or other clear acute thoracic process.  I also reviewed radiology's interpretation and agree with the findings of moderate-sized sliding hiatal hernia with some postsurgical changes without any other acute process. ? ?BMP is remarkable for glucose of 196 without any other significant electrolyte or metabolic derangements.  CBC without leukocytosis or acute anemia.  BNP is double digits and overall clinical picture and imaging is not suggestive of volume overload.  Magnesium and hepatic function panel are unremarkable.  COVID influenza PCR is negative. ? ?Patient states he feels little better but still has some persistent chest tightness primarily in the left chest on reassessment.  He has a heart score of 5.  I am concerned that he has a history of high cholesterol, hypertension diabetes states he has not been treated for these in some time.  I discussed with on-call cardiologist Dr. Welton Flakes who will see patient.  Given concern for unstable angina will start on heparin and admit to medicine service for further evaluation and  management. ? ?  ? ? ?FINAL CLINICAL IMPRESSION(S) / ED DIAGNOSES  ? ?Final diagnoses:  ?Chest pain, unspecified type  ?Hyperglycemia  ?History of gastric bypass  ?Cardiomyopathy, unspecified type (HCC)  ? ? ? ?Rx / DC Or

## 2021-12-04 ENCOUNTER — Ambulatory Visit: Payer: Self-pay | Admitting: Cardiology

## 2021-12-04 DIAGNOSIS — E1165 Type 2 diabetes mellitus with hyperglycemia: Secondary | ICD-10-CM | POA: Diagnosis present

## 2021-12-04 DIAGNOSIS — I110 Hypertensive heart disease with heart failure: Secondary | ICD-10-CM | POA: Diagnosis present

## 2021-12-04 DIAGNOSIS — I428 Other cardiomyopathies: Secondary | ICD-10-CM | POA: Diagnosis present

## 2021-12-04 DIAGNOSIS — I951 Orthostatic hypotension: Secondary | ICD-10-CM | POA: Diagnosis present

## 2021-12-04 DIAGNOSIS — F1020 Alcohol dependence, uncomplicated: Secondary | ICD-10-CM

## 2021-12-04 DIAGNOSIS — J849 Interstitial pulmonary disease, unspecified: Secondary | ICD-10-CM | POA: Diagnosis present

## 2021-12-04 DIAGNOSIS — R072 Precordial pain: Secondary | ICD-10-CM | POA: Diagnosis present

## 2021-12-04 DIAGNOSIS — F4310 Post-traumatic stress disorder, unspecified: Secondary | ICD-10-CM | POA: Diagnosis present

## 2021-12-04 DIAGNOSIS — F329 Major depressive disorder, single episode, unspecified: Secondary | ICD-10-CM | POA: Diagnosis present

## 2021-12-04 DIAGNOSIS — Z9884 Bariatric surgery status: Secondary | ICD-10-CM | POA: Diagnosis not present

## 2021-12-04 DIAGNOSIS — R079 Chest pain, unspecified: Secondary | ICD-10-CM | POA: Diagnosis not present

## 2021-12-04 DIAGNOSIS — R42 Dizziness and giddiness: Secondary | ICD-10-CM

## 2021-12-04 DIAGNOSIS — I5022 Chronic systolic (congestive) heart failure: Secondary | ICD-10-CM | POA: Diagnosis present

## 2021-12-04 DIAGNOSIS — Z20822 Contact with and (suspected) exposure to covid-19: Secondary | ICD-10-CM | POA: Diagnosis present

## 2021-12-04 DIAGNOSIS — R739 Hyperglycemia, unspecified: Secondary | ICD-10-CM | POA: Diagnosis present

## 2021-12-04 DIAGNOSIS — F909 Attention-deficit hyperactivity disorder, unspecified type: Secondary | ICD-10-CM | POA: Diagnosis present

## 2021-12-04 DIAGNOSIS — Z79899 Other long term (current) drug therapy: Secondary | ICD-10-CM | POA: Diagnosis not present

## 2021-12-04 DIAGNOSIS — Z981 Arthrodesis status: Secondary | ICD-10-CM | POA: Diagnosis not present

## 2021-12-04 LAB — BASIC METABOLIC PANEL
Anion gap: 7 (ref 5–15)
BUN: 18 mg/dL (ref 8–23)
CO2: 27 mmol/L (ref 22–32)
Calcium: 8.2 mg/dL — ABNORMAL LOW (ref 8.9–10.3)
Chloride: 102 mmol/L (ref 98–111)
Creatinine, Ser: 1.13 mg/dL (ref 0.61–1.24)
GFR, Estimated: >60 mL/min (ref >60)
Glucose, Bld: 104 mg/dL — ABNORMAL HIGH (ref 70–99)
Potassium: 3.2 mmol/L — ABNORMAL LOW (ref 3.5–5.1)
Sodium: 136 mmol/L (ref 135–145)

## 2021-12-04 LAB — CBC
HCT: 38.5 % — ABNORMAL LOW (ref 39.0–52.0)
Hemoglobin: 13 g/dL (ref 13.0–17.0)
MCH: 32.4 pg (ref 26.0–34.0)
MCHC: 33.8 g/dL (ref 30.0–36.0)
MCV: 96 fL (ref 80.0–100.0)
Platelets: 160 10*3/uL (ref 150–400)
RBC: 4.01 MIL/uL — ABNORMAL LOW (ref 4.22–5.81)
RDW: 12.6 % (ref 11.5–15.5)
WBC: 6.1 10*3/uL (ref 4.0–10.5)
nRBC: 0 % (ref 0.0–0.2)

## 2021-12-04 LAB — HEPARIN LEVEL (UNFRACTIONATED)
Heparin Unfractionated: 0.38 IU/mL (ref 0.30–0.70)
Heparin Unfractionated: 0.61 IU/mL (ref 0.30–0.70)

## 2021-12-04 LAB — HIV ANTIBODY (ROUTINE TESTING W REFLEX): HIV Screen 4th Generation wRfx: NONREACTIVE

## 2021-12-04 LAB — MAGNESIUM: Magnesium: 1.8 mg/dL (ref 1.7–2.4)

## 2021-12-04 LAB — ECHOCARDIOGRAM COMPLETE
Area-P 1/2: 2.39 cm2
Height: 67 in
S' Lateral: 2.7 cm
Weight: 2888 oz

## 2021-12-04 LAB — HEMOGLOBIN A1C
Hgb A1c MFr Bld: 6.8 % — ABNORMAL HIGH (ref 4.8–5.6)
Mean Plasma Glucose: 148.46 mg/dL

## 2021-12-04 LAB — GLUCOSE, CAPILLARY
Glucose-Capillary: 191 mg/dL — ABNORMAL HIGH (ref 70–99)
Glucose-Capillary: 134 mg/dL — ABNORMAL HIGH (ref 70–99)

## 2021-12-04 LAB — HEPATITIS C ANTIBODY: HCV Ab: NONREACTIVE

## 2021-12-04 MED ORDER — ASPIRIN EC 81 MG PO TBEC: 81.0000 mg | DELAYED_RELEASE_TABLET | Freq: Every day | ORAL | Status: DC

## 2021-12-04 MED ORDER — POTASSIUM CHLORIDE CRYS ER 20 MEQ PO TBCR
40.0000 meq | EXTENDED_RELEASE_TABLET | Freq: Once | ORAL | Status: AC
  Administered 2021-12-04: 40 meq via ORAL
  Filled 2021-12-04: qty 2

## 2021-12-04 MED ORDER — ASPIRIN EC 81 MG PO TBEC
81.0000 mg | DELAYED_RELEASE_TABLET | Freq: Every day | ORAL | Status: DC
  Administered 2021-12-04: 81 mg via ORAL
  Filled 2021-12-04: qty 1

## 2021-12-04 MED ORDER — SODIUM CHLORIDE 0.9 % IV SOLN: 250.0000 mL | INTRAVENOUS | Status: DC | PRN

## 2021-12-04 MED ORDER — ASPIRIN 81 MG PO CHEW
81.0000 mg | CHEWABLE_TABLET | ORAL | Status: AC
  Administered 2021-12-05: 81 mg via ORAL
  Filled 2021-12-04: qty 1

## 2021-12-04 MED ORDER — SODIUM CHLORIDE 0.9% FLUSH
3.0000 mL | Freq: Two times a day (BID) | INTRAVENOUS | Status: AC
  Filled 2021-12-04: qty 3

## 2021-12-04 MED ORDER — SODIUM CHLORIDE 0.9 % IV BOLUS
500.0000 mL | Freq: Once | INTRAVENOUS | Status: AC
  Administered 2021-12-04: 500 mL via INTRAVENOUS

## 2021-12-04 MED ORDER — INSULIN ASPART 100 UNIT/ML IJ SOLN: 0.0000 [IU] | Freq: Every day | INTRAMUSCULAR | Status: DC

## 2021-12-04 MED ORDER — SACUBITRIL-VALSARTAN 49-51 MG PO TABS
1.0000 | ORAL_TABLET | Freq: Two times a day (BID) | ORAL | Status: DC
  Administered 2021-12-04 – 2021-12-05 (×2): 1 via ORAL
  Filled 2021-12-04 (×2): qty 1

## 2021-12-04 MED ORDER — SODIUM CHLORIDE 0.9 % WEIGHT BASED INFUSION
1.0000 mL/kg/h | INTRAVENOUS | Status: DC
  Administered 2021-12-05: 1 mL/kg/h via INTRAVENOUS

## 2021-12-04 MED ORDER — RANOLAZINE ER 500 MG PO TB12
500.0000 mg | ORAL_TABLET | Freq: Two times a day (BID) | ORAL | Status: DC
  Administered 2021-12-04 (×2): 500 mg via ORAL
  Filled 2021-12-04 (×2): qty 1

## 2021-12-04 MED ORDER — INSULIN ASPART 100 UNIT/ML IJ SOLN
0.0000 [IU] | Freq: Three times a day (TID) | INTRAMUSCULAR | Status: DC
  Administered 2021-12-05: 2 [IU] via SUBCUTANEOUS
  Filled 2021-12-04 (×2): qty 1

## 2021-12-04 MED ORDER — ROSUVASTATIN CALCIUM 10 MG PO TABS
20.0000 mg | ORAL_TABLET | Freq: Every day | ORAL | Status: DC
  Administered 2021-12-04 – 2021-12-05 (×2): 20 mg via ORAL
  Filled 2021-12-04 (×2): qty 2

## 2021-12-04 MED ORDER — SODIUM CHLORIDE 0.9 % WEIGHT BASED INFUSION
3.0000 mL/kg/h | INTRAVENOUS | Status: AC
  Administered 2021-12-05: 3 mL/kg/h via INTRAVENOUS

## 2021-12-04 MED ORDER — SODIUM CHLORIDE 0.9% FLUSH: 3.0000 mL | INTRAVENOUS | Status: DC | PRN

## 2021-12-04 NOTE — Progress Notes (Signed)
?Progress Note ? ? ?Patient: Steven Brandt NIO:[id number] DOB: 07-25-1956 DOA: 12/03/2021     0 ?DOS: the patient was seen and examined on 12/04/2021 ?  ?Brief hospital course: ?Taken from [facility] ? ?Manolo Bosket is a 66 y.o. male with medical history significant for non-ischemic cardiomyopathy, ILD, htn, alcohol abuse, gastric bypass, who presents with chest pain. ? ?Patient has an history of nonischemic cardiomyopathy and she used to have HFrEF which has been improved.  No history of coronary artery disease. ? ?Patient was having some sternal chest pain, feels more like pressure, no exertional component.  Pain was intermittently going on for the last couple of weeks.  Yesterday it was radiating to the left shoulder so he came to ED for evaluation. ? ?On arrival he was hemodynamically stable.  Troponin remains negative.  EKG without any acute changes.  Echocardiogram with normal EF, grade 1 diastolic dysfunction and no regional wall motion abnormalities.  Cardiology was consulted from ED and he was started on heparin infusion for concern of ACS.  Heparin infusion was discontinued this morning as troponin remains negative. ? ?Initial plan was to discharge him after adding Ranexa to his medications and have a close follow-up with cardiology for further recommendations.  Patient insisting to get the cardiac catheterization, after talking with his cardiologist he will stay overnight and they will proceed with angiography tomorrow. ? ?Patient also developed some hypotension with a positive orthostatic vital sign and becoming dizzy when standing from sitting position this morning.  Patient did received isosorbide earlier which was switched with Ranexa by cardiology.  He was also on multiple other medications which include Entresto 49-51 mg, carvedilol 12.5 mg twice daily, Lasix 20 mg daily, spironolactone 25 mg daily. ?He was given a 500 cc bolus. ? ?Patient will be going for cardiac catheterization tomorrow with Dr.  Welton Flakes. ? ?A1c was checked due to elevated blood glucose level and it came back at 6.8 which makes him diabetic.  We will manage with SSI while in the hospital.  He needs to discuss with his primary care provider for further recommendations and to start antidiabetic medications. ? ? ?Assessment and Plan: ?* Chest pain ?Patient has both typical and atypical features of chest pain.  Intermittent substernal chest pain, feels like pressure and only 1 time radiated to left shoulder.  No exertional component.  Mild tenderness.  Troponin remain negative and no acute EKG changes. ?Lipid profile with LDL of 124, HDL 39, normal total cholesterol and triglyceride.  Goal LDL should be less than 70.  He was not on any lipid-lowering medications at home. ?-Start him on Crestor 20 mg daily ?-Start him on aspirin 81 mg daily ?-Going for cardiac catheterization at his request tomorrow ? ?Dizziness ?Patient was feeling little dizzy after taking morning meds. ?Orthostatic vitals were checked and they were positive. ?Patient is on multiple blood pressure lowering medications, initially cardiology started him on Imdur, that was switched with Ranexa this morning due to softer blood pressure.  ?-Give him 500 cc bolus. ?-Will hold some of his medications in the evening as needed ? ?Cardiomyopathy (HCC) ?Patient has an history of nonischemic cardiomyopathy with reduced EF many years ago, EF has been normalized.  He continues to take Lasix, spironolactone, Entresto and carvedilol. ?Repeat echocardiogram with normal EF and grade 1 diastolic dysfunction.  No other abnormality noted. ?-Going for a repeat cardiac catheterization tomorrow. ?-Follow cardiology recommendations ? ?Controlled type 2 diabetes mellitus without complication, without long-term current use of insulin (  HCC) ?Patient was found to have hyperglycemia.  No prior diagnosis of diabetes.  A1c was checked and it was 6.8 which makes him in diabetic range. ?-SSI ?-Patient need to  follow-up with PCP for further recommendations ? ?Post-traumatic stress disorder, unspecified ?- Continue home Wellbutrin and mirtazapine ? ? ?Subjective: Patient continued to have intermittent pressure-like substernal chest pain, nonradiating.  No exertional component.  No associated nausea, vomiting or shortness of breath.  He was very concerned and got upset when told to follow-up with cardiology as an outpatient.  He wants to have his cardiac catheterization done before leaving the hospital. ? ?Physical Exam: ?Vitals:  ? 12/04/21 1122 12/04/21 1328 12/04/21 1330 12/04/21 1331  ?BP: (!) 82/63 100/65 93/65 (!) 89/67  ?Pulse: 84     ?Resp:      ?Temp:      ?TempSrc:      ?SpO2:      ?Weight:      ?Height:      ? ?General.     In no acute distress. ?Pulmonary.  Lungs clear bilaterally, normal respiratory effort. ?CV.  Regular rate and rhythm, no JVD, rub or murmur. ?Abdomen.  Soft, nontender, nondistended, BS positive. ?CNS.  Alert and oriented x3.  No focal neurologic deficit. ?Extremities.  No edema, no cyanosis, pulses intact and symmetrical. ?Psychiatry.  Judgment and insight appears normal.  ? ?Data Reviewed: ?Prior notes, labs and images reviewed ? ?Family Communication: Discussed with patient ? ?Disposition: ?Status is: Inpatient ?Remains inpatient appropriate because: Going for cardiac catheterization tomorrow ? Planned Discharge Destination: Home ? ?Time spent: 45 minutes ? ?This record has been created using Conservation officer, historic buildings. Errors have been sought and corrected,but may not always be located. Such creation errors do not reflect on the standard of care.  ? ?Author: ?Arnetha Courser, MD ?12/04/2021 2:09 PM ? ?For on call review [email or web address].  ?

## 2021-12-04 NOTE — Progress Notes (Signed)
SUBJECTIVE: Steven Brandt is a 66 y.o. male with medical history significant for non-ischemic cardiomyopathy, CHF, HTN, alcohol abuse, gastric bypass, who presented to the ED yesterday with ongoing chest pain. ? ?Patient reports ongoing chest pain.  ? ? ?Vitals:  ? 12/04/21 0012 12/04/21 0518 12/04/21 9326 12/04/21 0801  ?BP: (!) 110/55 (!) 86/55 (!) 92/57 (!) 91/57  ?Pulse: 73 (!) 58 (!) 57 65  ?Resp: 20 18 17 19   ?Temp: 97.6 ?F (36.4 ?C) (!) 97.4 ?F (36.3 ?C)  97.9 ?F (36.6 ?C)  ?TempSrc: Oral Oral    ?SpO2: 96% 96%  95%  ?Weight:      ?Height:      ? ? ?Intake/Output Summary (Last 24 hours) at 12/04/2021 0838 ?Last data filed at 12/04/2021 12/06/2021 ?Gross per 24 hour  ?Intake 461.2 ml  ?Output 250 ml  ?Net 211.2 ml  ? ? ?LABS: ?Basic Metabolic Panel: ?Recent Labs  ?  12/03/21 ?12/05/21 12/04/21 ?0256  ?NA 136 136  ?K 3.8 3.2*  ?CL 100 102  ?CO2 26 27  ?GLUCOSE 196* 104*  ?BUN 15 18  ?CREATININE 1.12 1.13  ?CALCIUM 9.1 8.2*  ?MG 1.9  --   ? ?Liver Function Tests: ?Recent Labs  ?  12/03/21 ?12/05/21  ?AST 21  ?ALT 26  ?ALKPHOS 82  ?BILITOT 0.9  ?PROT 6.8  ?ALBUMIN 3.9  ? ?No results for input(s): LIPASE, AMYLASE in the last 72 hours. ?CBC: ?Recent Labs  ?  12/03/21 ?12/05/21 12/04/21 ?0256  ?WBC 7.2 6.1  ?HGB 15.2 13.0  ?HCT 45.3 38.5*  ?MCV 97.2 96.0  ?PLT 194 160  ? ?Cardiac Enzymes: ?No results for input(s): CKTOTAL, CKMB, CKMBINDEX, TROPONINI in the last 72 hours. ?BNP: ?Invalid input(s): POCBNP ?D-Dimer: ?No results for input(s): DDIMER in the last 72 hours. ?Hemoglobin A1C: ?No results for input(s): HGBA1C in the last 72 hours. ?Fasting Lipid Panel: ?Recent Labs  ?  12/03/21 ?12/05/21  ?CHOL 192  ?HDL 39*  ?LDLCALC 124*  ?TRIG 146  ?CHOLHDL 4.9  ? ?Thyroid Function Tests: ?No results for input(s): TSH, T4TOTAL, T3FREE, THYROIDAB in the last 72 hours. ? ?Invalid input(s): FREET3 ?Anemia Panel: ?No results for input(s): VITAMINB12, FOLATE, FERRITIN, TIBC, IRON, RETICCTPCT in the last 72 hours. ? ? ?PHYSICAL EXAM ?General: Well  developed, well nourished, in no acute distress ?HEENT:  Normocephalic and atramatic ?Neck:  No JVD.  ?Lungs: Clear bilaterally to auscultation and percussion. ?Heart: HRRR . Normal S1 and S2 without gallops or murmurs.  ?Abdomen: Bowel sounds are positive, abdomen soft and non-tender  ?Msk:  Back normal, normal gait. Normal strength and tone for age. ?Extremities: No clubbing, cyanosis or edema.   ?Neuro: Alert and oriented X 3. ?Psych:  Good affect, responds appropriately ? ?TELEMETRY: NSR, HR 71 bpm ? ?ASSESSMENT AND PLAN: Patient continues to have ongoing chest pain. Isosorbide added yesterday, no relief of pain, now hypotensive. Change isosorbide to Ranexa 500 mg twice daily. Troponin levels peaked at 8. No acute changes on EKG done in ED. No clinical indication for heart catheterization at this time. Echo results pending. Will continue to follow. ? ?Principal Problem: ?  Chest pain ?Active Problems: ?  Post-traumatic stress disorder, unspecified ?  Attention deficit hyperactivity disorder ?  Cardiomyopathy (HCC) ?  Chronic systolic heart failure (HCC) ?  Essential hypertension ?  Gastroesophageal reflux disease ?  Alcoholism (HCC) ?  History of Roux-en-Y gastric bypass ?  History of spinal fusion ?  Interstitial lung disease (HCC) ?  ? ?5397,  FNP-C ?12/04/2021 ?8:38 AM ? ? ? ?    ?

## 2021-12-04 NOTE — Assessment & Plan Note (Signed)
Patient has an history of nonischemic cardiomyopathy with reduced EF many years ago, EF has been normalized.  He continues to take Lasix, spironolactone, Entresto and carvedilol. ?Repeat echocardiogram with normal EF and grade 1 diastolic dysfunction.  No other abnormality noted. ?-Going for a repeat cardiac catheterization tomorrow. ?-Follow cardiology recommendations ?

## 2021-12-04 NOTE — Progress Notes (Signed)
SUBJECTIVE: Patient requesting left heart catheterization for ongoing intermittent chest pain.  ? ? ?Vitals:  ? 12/04/21 1122 12/04/21 1328 12/04/21 1330 12/04/21 1331  ?BP: (!) 82/63 100/65 93/65 (!) 89/67  ?Pulse: 84     ?Resp:      ?Temp:      ?TempSrc:      ?SpO2:      ?Weight:      ?Height:      ? ? ?Intake/Output Summary (Last 24 hours) at 12/04/2021 1447 ?Last data filed at 12/04/2021 1325 ?Gross per 24 hour  ?Intake 461.2 ml  ?Output 950 ml  ?Net -488.8 ml  ? ? ?LABS: ?Basic Metabolic Panel: ?Recent Labs  ?  12/03/21 ?[id number] 12/04/21 ?0256 12/04/21 ?[id number]  ?NA 136 136  --   ?K 3.8 3.2*  --   ?CL 100 102  --   ?CO2 26 27  --   ?GLUCOSE 196* 104*  --   ?BUN 15 18  --   ?CREATININE 1.12 1.13  --   ?CALCIUM 9.1 8.2*  --   ?MG 1.9  --  1.8  ? ?Liver Function Tests: ?Recent Labs  ?  12/03/21 ?0837  ?AST 21  ?ALT 26  ?ALKPHOS 82  ?BILITOT 0.9  ?PROT 6.8  ?ALBUMIN 3.9  ? ?No results for input(s): LIPASE, AMYLASE in the last 72 hours. ?CBC: ?Recent Labs  ?  12/03/21 ?[id number] 12/04/21 ?0256  ?WBC 7.2 6.1  ?HGB 15.2 13.0  ?HCT 45.3 38.5*  ?MCV 97.2 96.0  ?PLT 194 160  ? ?Cardiac Enzymes: ?No results for input(s): CKTOTAL, CKMB, CKMBINDEX, TROPONINI in the last 72 hours. ?BNP: ?Invalid input(s): POCBNP ?D-Dimer: ?No results for input(s): DDIMER in the last 72 hours. ?Hemoglobin A1C: ?Recent Labs  ?  12/04/21 ?0256  ?HGBA1C 6.8*  ? ?Fasting Lipid Panel: ?Recent Labs  ?  12/03/21 ?0837  ?CHOL 192  ?HDL 39*  ?[place] 124*  ?TRIG 146  ?CHOLHDL 4.9  ? ?Thyroid Function Tests: ?No results for input(s): TSH, T4TOTAL, T3FREE, THYROIDAB in the last 72 hours. ? ?Invalid input(s): FREET3 ?Anemia Panel: ?No results for input(s): VITAMINB12, FOLATE, FERRITIN, TIBC, IRON, RETICCTPCT in the last 72 hours. ? ? ?PHYSICAL EXAM ?General: Well developed, well nourished, in no acute distress ?HEENT:  Normocephalic and atramatic ?Neck:  No JVD.  ?Lungs: Clear bilaterally to auscultation and percussion. ?Heart: HRRR . Normal S1 and S2 without gallops  or murmurs.  ?Abdomen: Bowel sounds are positive, abdomen soft and non-tender  ?Msk:  Back normal, normal gait. Normal strength and tone for age. ?Extremities: No clubbing, cyanosis or edema.   ?Neuro: Alert and oriented X 3. ?Psych:  Good affect, responds appropriately ? ? ?ASSESSMENT AND PLAN: Patient scheduled for left heart catheterization on 12/05/21. Discussed risks and benefits. Patient consented.  ? ?Principal Problem: ?  Chest pain ?Active Problems: ?  Post-traumatic stress disorder, unspecified ?  Attention deficit hyperactivity disorder ?  Cardiomyopathy ([place]) ?  Controlled type 2 diabetes mellitus without complication, without long-term current use of insulin ([place]) ?  Essential hypertension ?  Gastroesophageal reflux disease ?  Alcoholism ([place]) ?  History of Roux-en-Y gastric bypass ?  History of spinal fusion ?  Interstitial lung disease ([place]) ?  Dizziness ?  ? ?[employer], FNP-C ?12/04/2021 ?2:47 PM ? ? ? ?  ?

## 2021-12-04 NOTE — Progress Notes (Signed)
?   12/04/21 0800  ?Clinical Encounter Type  ?Visited With Patient  ?Visit Type Initial  ?Referral From Other (Comment) ?([place] for Advance Directive)  ? ?Chaplain responded to Spiritual Consult for Advance Directive. Appropriate arrangements made. ?

## 2021-12-04 NOTE — Progress Notes (Signed)
?  Transition of Care (TOC) Screening Note ? ? ?Patient Details  ?Name: Steven Brandt ?Date of Birth: 08-28-1956 ? ? ?Transition of Care (TOC) CM/SW Contact:    ?Alberteen Sam, LCSW ?Phone Number: ?12/04/2021, 8:53 AM ? ? ? ?Transition of Care Department [facility]) has reviewed patient and no TOC needs have been identified at this time. We will continue to monitor patient advancement through interdisciplinary progression rounds. If new patient transition needs arise, please place a TOC consult. ? Pricilla Riffle, [place] ?[phone] ? ?

## 2021-12-04 NOTE — Assessment & Plan Note (Signed)
Patient was feeling little dizzy after taking morning meds. ?Orthostatic vitals were checked and they were positive. ?Patient is on multiple blood pressure lowering medications, initially cardiology started him on Imdur, that was switched with Ranexa this morning due to softer blood pressure.  ?-Give him 500 cc bolus. ?-Will hold some of his medications in the evening as needed ?

## 2021-12-04 NOTE — Assessment & Plan Note (Signed)
Patient was found to have hyperglycemia.  No prior diagnosis of diabetes.  A1c was checked and it was 6.8 which makes him in diabetic range. ?-SSI ?-Patient need to follow-up with PCP for further recommendations ?

## 2021-12-04 NOTE — Hospital Course (Addendum)
Taken from [facility] & P. ? ?Steven Brandt is a 66 y.o. male with medical history significant for non-ischemic cardiomyopathy, ILD, htn, alcohol abuse, gastric bypass, who presents with chest pain. ? ?Patient has an history of nonischemic cardiomyopathy and she used to have HFrEF which has been improved.  No history of coronary artery disease. ? ?Patient was having some sternal chest pain, feels more like pressure, no exertional component.  Pain was intermittently going on for the last couple of weeks.  Yesterday it was radiating to the left shoulder so he came to ED for evaluation. ? ?On arrival he was hemodynamically stable.  Troponin remains negative.  EKG without any acute changes.  Echocardiogram with normal EF, grade 1 diastolic dysfunction and no regional wall motion abnormalities.  Cardiology was consulted from ED and he was started on heparin infusion for concern of ACS.  Heparin infusion was discontinued this morning as troponin remains negative. ? ?Initial plan was to discharge him after adding Ranexa to his medications and have a close follow-up with cardiology for further recommendations.  Patient insisting to get the cardiac catheterization, after talking with his cardiologist he will stay overnight and they will proceed with angiography tomorrow. ? ?Patient also developed some hypotension with a positive orthostatic vital sign and becoming dizzy when standing from sitting position this morning.  Patient did received isosorbide earlier which was switched with Ranexa by cardiology.  He was also on multiple other medications which include Entresto 49-51 mg, carvedilol 12.5 mg twice daily, Lasix 20 mg daily, spironolactone 25 mg daily. ?He was given a 500 cc bolus. ? ?Patient underwent successful cardiac catheterization today.  He was having clean coronaries. ?Due to concern of softer blood pressure we decreased the dose of Entresto and carvedilol, spironolactone was discontinued after discussing with his  cardiologist and he will follow-up with them for further recommendations. ? ?A1c was checked due to elevated blood glucose level and it came back at 6.8 which makes him diabetic.  We will manage with SSI while in the hospital.  He needs to discuss with his primary care provider for further recommendations and to start antidiabetic medications. ? ?Patient will continue with current medications and follow-up with his providers. ?

## 2021-12-04 NOTE — Progress Notes (Signed)
?   12/04/21 1600  ?Clinical Encounter Type  ?Visited With Patient  ?Visit Type Initial  ?Referral From Chaplain  ?Consult/Referral To Chaplain  ? ?Chaplain provided compassionate presence and reflective listening as patient spoke about diagnosis and hospital stay. Patient appreciated Chaplain visit. ?

## 2021-12-04 NOTE — Assessment & Plan Note (Addendum)
Patient has both typical and atypical features of chest pain.  Intermittent substernal chest pain, feels like pressure and only 1 time radiated to left shoulder.  No exertional component.  Mild tenderness.  Troponin remain negative and no acute EKG changes. ?Lipid profile with LDL of 124, HDL 39, normal total cholesterol and triglyceride.  Goal LDL should be less than 70.  He was not on any lipid-lowering medications at home. ?-Start him on Crestor 20 mg daily ?-Start him on aspirin 81 mg daily ?-Going for cardiac catheterization at his request tomorrow ?

## 2021-12-04 NOTE — Assessment & Plan Note (Signed)
-   Continue home Wellbutrin and mirtazapine ?

## 2021-12-04 NOTE — Progress Notes (Signed)
ANTICOAGULATION CONSULT NOTE ? ?Pharmacy Consult for heparin infusion initiation & monitoring ?Indication: chest pain/ACS ? ?Patient Measurements: ?Height: 5\' 7"  (170.2 cm) ?Weight: 81.9 kg (180 lb 8 oz) ?IBW/kg (Calculated) : 66.1 ?Heparin Dosing Weight: 82 kg ? ? ?Labs: ?Recent Labs  ?  12/03/21 ?12/05/21 12/03/21 ?12/05/21 12/03/21 ?1023 12/03/21 ?1908 12/04/21 ?0256  ?HGB 15.2  --   --   --  13.0  ?HCT 45.3  --   --   --  38.5*  ?PLT 194  --   --   --  160  ?APTT  --  32  --   --   --   ?LABPROT  --  13.8  --   --   --   ?INR  --  1.1  --   --   --   ?HEPARINUNFRC  --   --   --  0.24* 0.38  ?CREATININE 1.12  --   --   --  1.13  ?TROPONINIHS 8  --  8  --   --   ? ? ?Estimated Creatinine Clearance: 67.6 mL/min (by C-G formula based on SCr of 1.13 mg/dL). ? ?Assessment:  66 y.o. male w/ PMH of gastric bypass, cardiomyopathy, anxiety, chronic back pain, HTN, DM, GERD, and interstitial lung disease who presents for evaluation of chest pain.  Pharmacy is asked to initiate heparin for ACS. He is on no chronic anticoagulation according to Med Rec. ? ?Goal of Therapy:  ?Heparin level 0.3-0.7 units/ml ?Monitor platelets by anticoagulation protocol: Yes ?  ?Plan:  ?3/29:  HL @ 0256 = 0.38, therapeutic X 1 ?Will continue pt on current rate and recheck HL in 6 hrs.  ? ?Katilin Raynes D ?12/04/2021,3:54 AM ? ? ?

## 2021-12-05 ENCOUNTER — Encounter
Admission: EM | Disposition: A | Discharge status: Home / Self Care | Payer: Self-pay | Source: Home / Self Care | Attending: Internal Medicine

## 2021-12-05 ENCOUNTER — Encounter: Payer: Self-pay | Admitting: Cardiovascular Disease

## 2021-12-05 ENCOUNTER — Other Ambulatory Visit: Payer: Self-pay

## 2021-12-05 HISTORY — PX: LEFT HEART CATH AND CORONARY ANGIOGRAPHY: CATH118249

## 2021-12-05 LAB — GLUCOSE, CAPILLARY
Glucose-Capillary: 139 mg/dL — ABNORMAL HIGH (ref 70–99)
Glucose-Capillary: 128 mg/dL — ABNORMAL HIGH (ref 70–99)
Glucose-Capillary: 162 mg/dL — ABNORMAL HIGH (ref 70–99)

## 2021-12-05 SURGERY — LEFT HEART CATH AND CORONARY ANGIOGRAPHY
Anesthesia: Moderate Sedation

## 2021-12-05 MED ORDER — SODIUM CHLORIDE 0.9 % IV SOLN: 250.0000 mL | INTRAVENOUS | Status: DC | PRN

## 2021-12-05 MED ORDER — MIDAZOLAM HCL 2 MG/2ML IJ SOLN
INTRAMUSCULAR | Status: DC | PRN
  Administered 2021-12-05: .5 mg via INTRAVENOUS

## 2021-12-05 MED ORDER — SACUBITRIL-VALSARTAN 24-26 MG PO TABS
1.0000 | ORAL_TABLET | Freq: Two times a day (BID) | ORAL | 0 refills | Status: DC
  Filled 2021-12-05: qty 60, 30d supply, fill #0

## 2021-12-05 MED ORDER — CARVEDILOL 6.25 MG PO TABS
6.2500 mg | ORAL_TABLET | Freq: Two times a day (BID) | ORAL | 0 refills | Status: DC
  Filled 2021-12-05: qty 60, 30d supply, fill #0

## 2021-12-05 MED ORDER — ACETAMINOPHEN 325 MG PO TABS: 650.0000 mg | ORAL_TABLET | ORAL | Status: DC | PRN

## 2021-12-05 MED ORDER — MIDAZOLAM HCL 2 MG/2ML IJ SOLN
INTRAMUSCULAR | Status: AC
  Filled 2021-12-05: qty 2

## 2021-12-05 MED ORDER — HEPARIN (PORCINE) IN NACL 1000-0.9 UT/500ML-% IV SOLN
INTRAVENOUS | Status: DC | PRN
  Administered 2021-12-05 (×2): 500 mL

## 2021-12-05 MED ORDER — ASPIRIN 81 MG PO TBEC
81.0000 mg | DELAYED_RELEASE_TABLET | Freq: Every day | ORAL | 11 refills | Status: AC
Start: 2021-12-06 — End: ?
  Filled 2021-12-05: qty 120, 90d supply, fill #0
  Filled 2022-07-11: qty 120, 90d supply, fill #1
  Filled 2022-10-05: qty 120, 90d supply, fill #2

## 2021-12-05 MED ORDER — HYDRALAZINE HCL 20 MG/ML IJ SOLN: 10.0000 mg | INTRAMUSCULAR | Status: DC | PRN

## 2021-12-05 MED ORDER — NITROGLYCERIN 0.4 MG SL SUBL
0.4000 mg | SUBLINGUAL_TABLET | SUBLINGUAL | 12 refills | Status: AC | PRN
  Filled 2021-12-05: qty 25, 1d supply, fill #0

## 2021-12-05 MED ORDER — LIDOCAINE HCL (PF) 1 % IJ SOLN
INTRAMUSCULAR | Status: DC | PRN
  Administered 2021-12-05: 20 mL

## 2021-12-05 MED ORDER — SODIUM CHLORIDE 0.9% FLUSH: 3.0000 mL | INTRAVENOUS | Status: DC | PRN

## 2021-12-05 MED ORDER — IOHEXOL 300 MG/ML  SOLN
INTRAMUSCULAR | Status: DC | PRN
  Administered 2021-12-05: 46 mL

## 2021-12-05 MED ORDER — SODIUM CHLORIDE 0.9% FLUSH: 3.0000 mL | Freq: Two times a day (BID) | INTRAVENOUS | Status: DC

## 2021-12-05 MED ORDER — FENTANYL CITRATE (PF) 100 MCG/2ML IJ SOLN
INTRAMUSCULAR | Status: DC | PRN
Start: 2021-12-05 — End: 2021-12-05
  Administered 2021-12-05: 25 ug via INTRAVENOUS

## 2021-12-05 MED ORDER — FENTANYL CITRATE (PF) 100 MCG/2ML IJ SOLN
INTRAMUSCULAR | Status: AC
  Filled 2021-12-05: qty 2

## 2021-12-05 MED ORDER — ONDANSETRON HCL 4 MG/2ML IJ SOLN: 4.0000 mg | Freq: Four times a day (QID) | INTRAMUSCULAR | Status: DC | PRN

## 2021-12-05 MED ORDER — HEPARIN (PORCINE) IN NACL 1000-0.9 UT/500ML-% IV SOLN
INTRAVENOUS | Status: AC
  Filled 2021-12-05: qty 1000

## 2021-12-05 MED ORDER — LIDOCAINE HCL 1 % IJ SOLN
INTRAMUSCULAR | Status: AC
  Filled 2021-12-05: qty 20

## 2021-12-05 MED ORDER — ROSUVASTATIN CALCIUM 20 MG PO TABS
20.0000 mg | ORAL_TABLET | Freq: Every day | ORAL | 1 refills | Status: DC
  Filled 2021-12-05: qty 90, 90d supply, fill #0
  Filled 2022-07-11: qty 90, 90d supply, fill #1

## 2021-12-05 MED ORDER — LABETALOL HCL 5 MG/ML IV SOLN: 10.0000 mg | INTRAVENOUS | Status: DC | PRN

## 2021-12-05 MED ORDER — SODIUM CHLORIDE 0.9 % WEIGHT BASED INFUSION: 1.0000 mL/kg/h | INTRAVENOUS | Status: DC

## 2021-12-05 MED ORDER — PANTOPRAZOLE SODIUM 40 MG PO TBEC
40.0000 mg | DELAYED_RELEASE_TABLET | Freq: Two times a day (BID) | ORAL | Status: DC
  Administered 2021-12-05: 40 mg via ORAL
  Filled 2021-12-05: qty 1

## 2021-12-05 MED ORDER — SACUBITRIL-VALSARTAN 24-26 MG PO TABS
1.0000 | ORAL_TABLET | Freq: Two times a day (BID) | ORAL | Status: DC
Start: 2021-12-06 — End: 2021-12-05

## 2021-12-05 MED ORDER — CARVEDILOL 6.25 MG PO TABS
6.2500 mg | ORAL_TABLET | Freq: Two times a day (BID) | ORAL | Status: DC
Start: 2021-12-05 — End: 2021-12-05

## 2021-12-05 MED ORDER — PANTOPRAZOLE SODIUM 40 MG PO TBEC
40.0000 mg | DELAYED_RELEASE_TABLET | Freq: Two times a day (BID) | ORAL | 0 refills | Status: DC
  Filled 2021-12-05: qty 60, 30d supply, fill #0

## 2021-12-05 SURGICAL SUPPLY — 10 items
CATH INFINITI 5FR MULTPACK ANG (CATHETERS) ×1 IMPLANT
DEVICE CLOSURE MYNXGRIP 5F (Vascular Products) ×1 IMPLANT
NDL PERC 18GX7CM (NEEDLE) IMPLANT
NEEDLE PERC 18GX7CM (NEEDLE) ×2 IMPLANT
PACK CARDIAC CATH (CUSTOM PROCEDURE TRAY) ×2 IMPLANT
PROTECTION STATION PRESSURIZED (MISCELLANEOUS) ×2
SET ATX SIMPLICITY (MISCELLANEOUS) ×1 IMPLANT
SHEATH AVANTI 5FR X 11CM (SHEATH) ×1 IMPLANT
STATION PROTECTION PRESSURIZED (MISCELLANEOUS) IMPLANT
WIRE GUIDERIGHT .035X150 (WIRE) ×1 IMPLANT

## 2021-12-05 NOTE — Progress Notes (Signed)
?  RD consulted for nutrition education regarding diabetes.  ? ?Lab Results  ?Component Value Date  ? HGBA1C 6.8 (H) 12/04/2021  ? PTA DM medications are none.  ? ?Pt in with chaplain at time of visit.  ? ?Per DM coordinator note, pt has had history of DM in the past and was taken off medications years ago.  ? ?Labs reviewed: CBGS: 128-191 (inpatient orders for glycemic control are 0-5 units insulin aspart daily at bedtime and 0-9 units insulin aspart TID with meals).   ? ?RD provided "Carbohydrate Counting for People with Diabetes" and "Plate Method"handouts from the Academy of Nutrition and Dietetics.  ? ?RD also referred pt to [facility]'s Nutrition and Diabetes Education Services for further education and support.  ? ?Current diet order is regular, patient is consuming approximately 50% of meals at this time. Labs and medications reviewed. No further nutrition interventions warranted at this time. RD contact information provided. If additional nutrition issues arise, please re-consult RD. ? ?Levada Schilling, RD, LDN, CDCES ?Registered Dietitian II ?Certified Diabetes Care and Education Specialist ?Please refer to [facility] for RD and/or RD on-call/weekend/after hours pager   ?

## 2021-12-05 NOTE — Discharge Instructions (Signed)

## 2021-12-05 NOTE — Discharge Summary (Signed)
?Physician Discharge Summary ?  ?Patient: Steven Brandt MRN: [id number] DOB: 1955-11-19  ?Admit date:     12/03/2021  ?Discharge date: 12/05/21  ?Discharge Physician: Arnetha Courser  ? ?PCP: Pcp, No  ? ?Recommendations at discharge:  ?Follow-up with cardiology within next week for further recommendations. ?A1c of 6.8 which makes him diabetic-PCP can start medications appropriately ?We also decreased the dose of carvedilol and Entresto, stopped spironolactone due to softer blood pressure, his cardiologist can further manage. ? ?Discharge Diagnoses: ?Principal Problem: ?  Chest pain ?Active Problems: ?  Dizziness ?  Cardiomyopathy (HCC) ?  Essential hypertension ?  Controlled type 2 diabetes mellitus without complication, without long-term current use of insulin (HCC) ?  Post-traumatic stress disorder, unspecified ?  Attention deficit hyperactivity disorder ?  Gastroesophageal reflux disease ?  Alcoholism (HCC) ?  History of Roux-en-Y gastric bypass ?  History of spinal fusion ?  Interstitial lung disease (HCC) ? ?Resolved Problems: ?  Chronic systolic heart failure (HCC) ?  Tobacco use disorder ? ?Hospital Course: ?Taken from H & P. ? ?Steven Brandt is a 66 y.o. male with medical history significant for non-ischemic cardiomyopathy, ILD, htn, alcohol abuse, gastric bypass, who presents with chest pain. ? ?Patient has an history of nonischemic cardiomyopathy and she used to have HFrEF which has been improved.  No history of coronary artery disease. ? ?Patient was having some sternal chest pain, feels more like pressure, no exertional component.  Pain was intermittently going on for the last couple of weeks.  Yesterday it was radiating to the left shoulder so he came to ED for evaluation. ? ?On arrival he was hemodynamically stable.  Troponin remains negative.  EKG without any acute changes.  Echocardiogram with normal EF, grade 1 diastolic dysfunction and no regional wall motion abnormalities.  Cardiology was  consulted from ED and he was started on heparin infusion for concern of ACS.  Heparin infusion was discontinued this morning as troponin remains negative. ? ?Initial plan was to discharge him after adding Ranexa to his medications and have a close follow-up with cardiology for further recommendations.  Patient insisting to get the cardiac catheterization, after talking with his cardiologist he will stay overnight and they will proceed with angiography tomorrow. ? ?Patient also developed some hypotension with a positive orthostatic vital sign and becoming dizzy when standing from sitting position this morning.  Patient did received isosorbide earlier which was switched with Ranexa by cardiology.  He was also on multiple other medications which include Entresto 49-51 mg, carvedilol 12.5 mg twice daily, Lasix 20 mg daily, spironolactone 25 mg daily. ?He was given a 500 cc bolus. ? ?Patient underwent successful cardiac catheterization today.  He was having clean coronaries. ?Due to concern of softer blood pressure we decreased the dose of Entresto and carvedilol, spironolactone was discontinued after discussing with his cardiologist and he will follow-up with them for further recommendations. ? ?A1c was checked due to elevated blood glucose level and it came back at 6.8 which makes him diabetic.  We will manage with SSI while in the hospital.  He needs to discuss with his primary care provider for further recommendations and to start antidiabetic medications. ? ?Patient will continue with current medications and follow-up with his providers. ? ?Assessment and Plan: ?* Chest pain ?Patient has both typical and atypical features of chest pain.  Intermittent substernal chest pain, feels like pressure and only 1 time radiated to left shoulder.  No exertional component.  Mild tenderness.  Troponin remain  negative and no acute EKG changes. ?Lipid profile with LDL of 124, HDL 39, normal total cholesterol and triglyceride.  Goal  LDL should be less than 70.  He was not on any lipid-lowering medications at home. ?-Start him on Crestor 20 mg daily ?-Start him on aspirin 81 mg daily ?-Going for cardiac catheterization at his request tomorrow ? ?Dizziness ?Patient was feeling little dizzy after taking morning meds. ?Orthostatic vitals were checked and they were positive. ?Patient is on multiple blood pressure lowering medications, initially cardiology started him on Imdur, that was switched with Ranexa this morning due to softer blood pressure.  ?-Give him 500 cc bolus. ?-Will hold some of his medications in the evening as needed ? ?Cardiomyopathy (HCC) ?Patient has an history of nonischemic cardiomyopathy with reduced EF many years ago, EF has been normalized.  He continues to take Lasix, spironolactone, Entresto and carvedilol. ?Repeat echocardiogram with normal EF and grade 1 diastolic dysfunction.  No other abnormality noted. ?-Going for a repeat cardiac catheterization tomorrow. ?-Follow cardiology recommendations ? ?Controlled type 2 diabetes mellitus without complication, without long-term current use of insulin (HCC) ?Patient was found to have hyperglycemia.  No prior diagnosis of diabetes.  A1c was checked and it was 6.8 which makes him in diabetic range. ?-SSI ?-Patient need to follow-up with PCP for further recommendations ? ?Post-traumatic stress disorder, unspecified ?- Continue home Wellbutrin and mirtazapine ? ? ?Consultants: Cardiology ?Procedures performed: Cardiac catheterization ?Disposition: Home ?Diet recommendation:  ?Discharge Diet Orders (From admission, onward)  ? ?  Start     Ordered  ? 12/05/21 0000  Diet - low sodium heart healthy       ? 12/05/21 1510  ? ?  ?  ? ?  ? ?Cardiac and Carb modified diet ?DISCHARGE MEDICATION: ?Allergies as of 12/05/2021   ? ?   Reactions  ? Biotene Dry Mouth [antiseptic Mouth Rinse] Anaphylaxis  ? Celecoxib   ? Gabapentin   ? Other reaction(s): Drowsy  ? Molds & Smuts   ? Other  reaction(s): Allergic rhinitis  ? Pregabalin   ? Other reaction(s): Depressive disorder  ? ?  ? ?  ?Medication List  ?  ? ?STOP taking these medications   ? ?Entresto 49-51 MG ?Generic drug: sacubitril-valsartan ?Replaced by: sacubitril-valsartan 24-26 MG ?  ?spironolactone 25 MG tablet ?Commonly known as: ALDACTONE ?  ? ?  ? ?TAKE these medications   ? ?aspirin 81 MG EC tablet ?Take 1 tablet (81 mg total) by mouth daily. Swallow whole. ?Start taking on: December 06, 2021 ?  ?buPROPion HCl ER (XL) 450 MG Tb24 ?Take 150 mg by mouth daily. ?  ?carvedilol 6.25 MG tablet ?Commonly known as: COREG ?Take 1 tablet (6.25 mg total) by mouth 2 (two) times daily with a meal. ?What changed:  ?medication strength ?how much to take ?  ?furosemide 20 MG tablet ?Commonly known as: LASIX ?Take 20 mg by mouth. ?  ?mirtazapine 15 MG tablet ?Commonly known as: REMERON ?Take 15 mg by mouth at bedtime. ?  ?nitroGLYCERIN 0.4 MG SL tablet ?Commonly known as: NITROSTAT ?Place 1 tablet (0.4 mg total) under the tongue every 5 (five) minutes as needed for chest pain. ?  ?pantoprazole 40 MG tablet ?Commonly known as: PROTONIX ?Take 1 tablet (40 mg total) by mouth 2 (two) times daily. ?  ?rosuvastatin 20 MG tablet ?Commonly known as: CRESTOR ?Take 1 tablet (20 mg total) by mouth daily. ?  ?sacubitril-valsartan 24-26 MG ?Commonly known as: ENTRESTO ?Take 1 tablet by  mouth 2 (two) times daily. ?Start taking on: December 06, 2021 ?Replaces: Entresto 49-51 MG ?  ?vitamin B-12 500 MCG tablet ?Commonly known as: CYANOCOBALAMIN ?Take 500 mcg by mouth daily. ?  ?vitamin C 1000 MG tablet ?Take 1,000 mg by mouth daily. ?  ? ?  ? ? Follow-up Information   ? ? Laurier Nancy, MD. Schedule an appointment as soon as possible for a visit in 3 day(s).   ?Specialty: Cardiology ?Contact information: ?[address] ?[place] [address] ?[phone] ? ? ?  ?  ? ?  ?  ? ?  ? ?Discharge Exam: ?Filed Weights  ? 12/03/21 1657 12/04/21 1541 12/05/21 0758  ?Weight: 81.9  kg 82.8 kg 79.4 kg  ? ?General.     In no acute distress. ?Pulmonary.  Lungs clear bilaterally, normal respiratory effort. ?CV.  Regular rate and rhythm, no JVD, rub or murmur. ?Abdomen.  Soft, nontender, nondistended,

## 2021-12-05 NOTE — Plan of Care (Signed)
?  Problem: Education: ?Goal: Knowledge of General Education information will improve ?Description: Including pain rating scale, medication(s)/side effects and non-pharmacologic comfort measures ?12/05/2021 0751 by Rigoberto Noel, RN ?Outcome: Progressing ?12/05/2021 0751 by Rigoberto Noel, RN ?Outcome: Progressing ?  ?Problem: Health Behavior/Discharge Planning: ?Goal: Ability to manage health-related needs will improve ?Outcome: Progressing ?  ?

## 2021-12-05 NOTE — Progress Notes (Signed)
SUBJECTIVE: No further chest pain ? ? ?Vitals:  ? 12/05/21 9798 12/05/21 9211 12/05/21 0758 12/05/21 0936  ?BP: (!) 87/50 107/73 100/78   ?Pulse: (!) 54 (!) 56 62   ?Resp: 16 17 16    ?Temp: 98 ?F (36.7 ?C) 98 ?F (36.7 ?C) 97.8 ?F (36.6 ?C)   ?TempSrc:   Oral   ?SpO2: 95% 98% 97% 100%  ?Weight:   79.4 kg   ?Height:   5\' 7"  (1.702 m)   ? ? ?Intake/Output Summary (Last 24 hours) at 12/05/2021 0958 ?Last data filed at 12/05/2021 [phone] ?Gross per 24 hour  ?Intake 860 ml  ?Output 700 ml  ?Net 160 ml  ? ? ?LABS: ?Basic Metabolic Panel: ?Recent Labs  ?  12/03/21 ?9417 12/04/21 ?0256 12/04/21 ?12/06/21  ?NA 136 136  --   ?K 3.8 3.2*  --   ?CL 100 102  --   ?CO2 26 27  --   ?GLUCOSE 196* 104*  --   ?BUN 15 18  --   ?CREATININE 1.12 1.13  --   ?CALCIUM 9.1 8.2*  --   ?MG 1.9  --  1.8  ? ?Liver Function Tests: ?Recent Labs  ?  12/03/21 ?4481  ?AST 21  ?ALT 26  ?ALKPHOS 82  ?BILITOT 0.9  ?PROT 6.8  ?ALBUMIN 3.9  ? ?No results for input(s): LIPASE, AMYLASE in the last 72 hours. ?CBC: ?Recent Labs  ?  12/03/21 ?8563 12/04/21 ?0256  ?WBC 7.2 6.1  ?HGB 15.2 13.0  ?HCT 45.3 38.5*  ?MCV 97.2 96.0  ?PLT 194 160  ? ?Cardiac Enzymes: ?No results for input(s): CKTOTAL, CKMB, CKMBINDEX, TROPONINI in the last 72 hours. ?BNP: ?Invalid input(s): POCBNP ?D-Dimer: ?No results for input(s): DDIMER in the last 72 hours. ?Hemoglobin A1C: ?Recent Labs  ?  12/04/21 ?0256  ?HGBA1C 6.8*  ? ?Fasting Lipid Panel: ?Recent Labs  ?  12/03/21 ?12/06/21  ?CHOL 192  ?HDL 39*  ?LDLCALC 124*  ?TRIG 146  ?CHOLHDL 4.9  ? ?Thyroid Function Tests: ?No results for input(s): TSH, T4TOTAL, T3FREE, THYROIDAB in the last 72 hours. ? ?Invalid input(s): FREET3 ?Anemia Panel: ?No results for input(s): VITAMINB12, FOLATE, FERRITIN, TIBC, IRON, RETICCTPCT in the last 72 hours. ? ? ?PHYSICAL EXAM ?General: Well developed, well nourished, in no acute distress ?HEENT:  Normocephalic and atramatic ?Neck:  No JVD.  ?Lungs: Clear bilaterally to auscultation and percussion. ?Heart: HRRR .  Normal S1 and S2 without gallops or murmurs.  ?Abdomen: Bowel sounds are positive, abdomen soft and non-tender  ?Msk:  Back normal, normal gait. Normal strength and tone for age. ?Extremities: No clubbing, cyanosis or edema.   ?Neuro: Alert and oriented X 3. ?Psych:  Good affect, responds appropriately ? ?TELEMETRY: Sinus rhythm ? ?ASSESSMENT AND PLAN: Normal coronaries with normal left ventricular systolic function.  Advise Protonix 40 twice daily and stop Ranexa.  Patient tolerated the procedure well and can go home later on today with follow-up Monday at my office at 9 AM. ? ?Principal Problem: ?  Chest pain ?Active Problems: ?  Post-traumatic stress disorder, unspecified ?  Attention deficit hyperactivity disorder ?  Cardiomyopathy (HCC) ?  Controlled type 2 diabetes mellitus without complication, without long-term current use of insulin (HCC) ?  Essential hypertension ?  Gastroesophageal reflux disease ?  Alcoholism (HCC) ?  History of Roux-en-Y gastric bypass ?  History of spinal fusion ?  Interstitial lung disease (HCC) ?  Dizziness ?  ? ?0263, MD, FACC ?12/05/2021 ?9:58 AM ? ? ? ?  ?

## 2021-12-05 NOTE — Progress Notes (Signed)
Steven Brandt to be D/C'd Home per MD order.  Discussed prescriptions and follow up appointments with the patient. Prescriptions given to patient, medication list explained in detail. Pt verbalized understanding. ? ?Allergies as of 12/05/2021   ? ?   Reactions  ? Biotene Dry Mouth [antiseptic Mouth Rinse] Anaphylaxis  ? Celecoxib   ? Gabapentin   ? Other reaction(s): Drowsy  ? Molds & Smuts   ? Other reaction(s): Allergic rhinitis  ? Pregabalin   ? Other reaction(s): Depressive disorder  ? ?  ? ?  ?Medication List  ?  ? ?STOP taking these medications   ? ?Entresto 49-51 MG ?Generic drug: sacubitril-valsartan ?Replaced by: sacubitril-valsartan 24-26 MG ?  ?spironolactone 25 MG tablet ?Commonly known as: ALDACTONE ?  ? ?  ? ?TAKE these medications   ? ?aspirin 81 MG EC tablet ?Take 1 tablet (81 mg total) by mouth daily. Swallow whole. ?Start taking on: December 06, 2021 ?  ?buPROPion HCl ER (XL) 450 MG Tb24 ?Take 150 mg by mouth daily. ?  ?carvedilol 6.25 MG tablet ?Commonly known as: COREG ?Take 1 tablet (6.25 mg total) by mouth 2 (two) times daily with a meal. ?What changed:  ?medication strength ?how much to take ?  ?furosemide 20 MG tablet ?Commonly known as: LASIX ?Take 20 mg by mouth. ?  ?mirtazapine 15 MG tablet ?Commonly known as: REMERON ?Take 15 mg by mouth at bedtime. ?  ?nitroGLYCERIN 0.4 MG SL tablet ?Commonly known as: NITROSTAT ?Place 1 tablet (0.4 mg total) under the tongue every 5 (five) minutes as needed for chest pain. ?  ?pantoprazole 40 MG tablet ?Commonly known as: PROTONIX ?Take 1 tablet (40 mg total) by mouth 2 (two) times daily. ?  ?rosuvastatin 20 MG tablet ?Commonly known as: CRESTOR ?Take 1 tablet (20 mg total) by mouth daily. ?  ?sacubitril-valsartan 24-26 MG ?Commonly known as: ENTRESTO ?Take 1 tablet by mouth 2 (two) times daily. ?Start taking on: December 06, 2021 ?Replaces: Entresto 49-51 MG ?  ?vitamin B-12 500 MCG tablet ?Commonly known as: CYANOCOBALAMIN ?Take 500 mcg by mouth daily. ?   ?vitamin C 1000 MG tablet ?Take 1,000 mg by mouth daily. ?  ? ?  ? ? ?Vitals:  ? 12/05/21 1457 12/05/21 1458  ?BP: 103/84 115/79  ?Pulse: (!) 101 83  ?Resp:    ?Temp:    ?SpO2: 98% 98%  ? ? ?Tele box removed and returned.Skin clean, dry and intact without evidence of skin break down, no evidence of skin tears noted. IV catheter discontinued intact. Site without signs and symptoms of complications. Dressing and pressure applied. Pt denies pain at this time. No complaints noted. ? ?An After Visit Summary was printed and given to the patient. ?Patient escorted via WC, and D/C home via private auto. ? ?Rigoberto Noel  ?

## 2021-12-05 NOTE — Progress Notes (Signed)
?   12/05/21 0900  ?Clinical Encounter Type  ?Visited With Patient and family together  ?Visit Type Follow-up;Pre-op  ?Spiritual Encounters  ?Spiritual Needs Prayer  ? ?Chaplain responded to call to provide pre procedure support to patient and wife through meaningful conversation and prayer. ?

## 2021-12-05 NOTE — Progress Notes (Signed)
Inpatient Diabetes Program Recommendations ? ?AACE/ADA: New Consensus Statement on Inpatient Glycemic Control (2015) ? ?Target Ranges:  Prepandial:   less than 140 mg/dL ?     Peak postprandial:   less than 180 mg/dL (1-2 hours) ?     Critically ill patients:  140 - 180 mg/dL  ? ? Latest Reference Range & Units 12/04/21 16:00 12/04/21 20:55 12/05/21 07:48 12/05/21 10:23 12/05/21 11:28  ?Glucose-Capillary 70 - 99 mg/dL 191 (H) 134 (H) 139 (H) 128 (H) 162 (H)  ?(H): Data is abnormally high ? Latest Reference Range & Units 12/04/21 02:56  ?Hemoglobin A1C 4.8 - 5.6 % 6.8 (H) ? ?(148 mg/dl)  ?(H): Data is abnormally high ? ? ?Admit with: CP ? ? ?Current Orders: Novolog Sensitive Correction Scale/ SSI (0-9 units) TID AC + HS ? ? ?MD- Please provide pt with CBG Rx at time of Discharge ?Order Number [id number] ? ? ? ?[facility] records show pt with previous history of diabetes--No record of diabetes meds at home ? ?Cardiac Cath this AM ? ? ? ?Met with pt and wife at bedside this afternoon.  Pt told me he underwent Gastric Bypass surgery 2007--has been eating several small meals per day at home since that surgery.  Was hospitalized in 2018 for CHF issues and received steroids during that hospital stay--had issues with hyperglycemia and stated the MD in the hospital back in 2018 diagnosed him with diabetes.  Received insulin in the hospital and was discharged to SNF for Rehab where he continued on insulin.  When he left Rehab for home, was taken off all diabetes meds.  Does not check CBGs at home (does not have CBG meter) but tries to follow healthy diet.  Stated he has been eating poorly the last few months due to stress and admits to drinking sweet tea and eating little debbie cakes and other sweets like gummy worms.  Pt stated he understands that he needs to stop drinking sugary drinks and also limit consumption of sweet items like desserts.  Admits to lost of stress over the last few months and has not been eating healthfully like  he normally does.  Did not have any specific questions about healthy diabetes nutrition.  States he avoid fried foods due to his gastric bypass.  We reviewed his current A1c of 6.8%.  Explained to pt that the ADA considers A1c of 6.5% or higher diagnostic for Diabetes.  Reviewed healthy CBG goals for home and discussed with pt that our goal is for him to keep his A1c at 7% or less to help prevent long-term diabetes complications.  Pt had minimal questions for me but was very appreciative of my visit with him.  Told pt I will ask MD to give him a Rx for CBG meter and that if he has to wait for the meter due to mail order issues, that he can purchase an inexpensive CBG meter and supplies OTC at [facility] stated understanding. ? ? ?--Will follow patient during hospitalization-- ? ?Wyn Quaker RN, MSN, CDE ?Diabetes Coordinator ?Inpatient Glycemic Control Team ?Team Pager: [phone] (8a-5p) ? ? ? ?

## 2021-12-06 NOTE — Patient Outreach (Signed)
Received a hospital discharge notification for Steven Brandt who has [employer] ?I have assigned Deloria Lair, NP to call for follow up and determine if there are any Case Management needs.  ?  ?Arville Care, CBCS, CMAA ?[place] Management Assistant ?[place] Management ?[phone]   ? ?

## 2021-12-09 ENCOUNTER — Other Ambulatory Visit: Payer: Self-pay | Admitting: *Deleted

## 2021-12-09 NOTE — Patient Outreach (Signed)
[facility]) Care Management ? ?12/09/2021 ? ?Steven Brandt [name] ?09-19-1955 ?[id number] ? ?Initial telephone outreach for transition of care. No answer, left a voicemail and requested a return call. ? ?[name]. [name], MSN, GNP-BC ?Gerontological Nurse Practitioner ?[facility] Care Management ?[phone] ? ? ?

## 2021-12-10 ENCOUNTER — Other Ambulatory Visit: Payer: Self-pay | Admitting: *Deleted

## 2021-12-10 NOTE — Patient Outreach (Signed)
[facility]) Care Management ? ?12/10/2021 ? ?Dontravious [name] ?10-29-1955 ?[id number] ? ?Telephone outreach for post hospitalization [facility] Care Management Services. Spoke to Mr. Freilich today and he reports he is doing fine and back to work. He declines the offer of care management servcies. He agrees to receive our information for future needs. ? ?[name]. Christian Borgerding, MSN, GNP-BC ?Gerontological Nurse Practitioner ?[facility] Care Management ?[phone] ? ? ?

## 2022-07-09 ENCOUNTER — Emergency Department: Payer: No Typology Code available for payment source

## 2022-07-09 ENCOUNTER — Observation Stay: Payer: No Typology Code available for payment source

## 2022-07-09 ENCOUNTER — Encounter: Payer: Self-pay | Admitting: Internal Medicine

## 2022-07-09 ENCOUNTER — Observation Stay
Admission: EM | Admit: 2022-07-09 | Discharge: 2022-07-10 | Disposition: A | Discharge status: Home / Self Care | Payer: No Typology Code available for payment source | Attending: Internal Medicine | Admitting: Internal Medicine

## 2022-07-09 ENCOUNTER — Observation Stay
Admit: 2022-07-09 | Discharge: 2022-07-09 | Disposition: A | Discharge status: Home / Self Care | Payer: No Typology Code available for payment source | Attending: Neurology | Admitting: Neurology

## 2022-07-09 DIAGNOSIS — I10 Essential (primary) hypertension: Secondary | ICD-10-CM | POA: Diagnosis not present

## 2022-07-09 DIAGNOSIS — R2689 Other abnormalities of gait and mobility: Secondary | ICD-10-CM | POA: Insufficient documentation

## 2022-07-09 DIAGNOSIS — I5032 Chronic diastolic (congestive) heart failure: Secondary | ICD-10-CM

## 2022-07-09 DIAGNOSIS — E785 Hyperlipidemia, unspecified: Secondary | ICD-10-CM | POA: Diagnosis present

## 2022-07-09 DIAGNOSIS — Z8673 Personal history of transient ischemic attack (TIA), and cerebral infarction without residual deficits: Secondary | ICD-10-CM | POA: Insufficient documentation

## 2022-07-09 DIAGNOSIS — R2990 Unspecified symptoms and signs involving the nervous system: Secondary | ICD-10-CM | POA: Diagnosis present

## 2022-07-09 DIAGNOSIS — Z79899 Other long term (current) drug therapy: Secondary | ICD-10-CM | POA: Insufficient documentation

## 2022-07-09 DIAGNOSIS — I5022 Chronic systolic (congestive) heart failure: Secondary | ICD-10-CM | POA: Insufficient documentation

## 2022-07-09 DIAGNOSIS — E119 Type 2 diabetes mellitus without complications: Secondary | ICD-10-CM | POA: Insufficient documentation

## 2022-07-09 DIAGNOSIS — F32A Depression, unspecified: Secondary | ICD-10-CM | POA: Insufficient documentation

## 2022-07-09 DIAGNOSIS — J849 Interstitial pulmonary disease, unspecified: Secondary | ICD-10-CM | POA: Insufficient documentation

## 2022-07-09 DIAGNOSIS — R55 Syncope and collapse: Secondary | ICD-10-CM | POA: Diagnosis not present

## 2022-07-09 DIAGNOSIS — K219 Gastro-esophageal reflux disease without esophagitis: Secondary | ICD-10-CM | POA: Insufficient documentation

## 2022-07-09 DIAGNOSIS — E041 Nontoxic single thyroid nodule: Secondary | ICD-10-CM | POA: Insufficient documentation

## 2022-07-09 DIAGNOSIS — I639 Cerebral infarction, unspecified: Secondary | ICD-10-CM | POA: Diagnosis not present

## 2022-07-09 DIAGNOSIS — I429 Cardiomyopathy, unspecified: Secondary | ICD-10-CM | POA: Diagnosis not present

## 2022-07-09 DIAGNOSIS — Z9884 Bariatric surgery status: Secondary | ICD-10-CM | POA: Insufficient documentation

## 2022-07-09 DIAGNOSIS — F1021 Alcohol dependence, in remission: Secondary | ICD-10-CM | POA: Diagnosis not present

## 2022-07-09 LAB — CBG MONITORING, ED
Glucose-Capillary: 194 mg/dL — ABNORMAL HIGH (ref 70–99)
Glucose-Capillary: 131 mg/dL — ABNORMAL HIGH (ref 70–99)

## 2022-07-09 LAB — URINE DRUG SCREEN, QUALITATIVE (ARMC ONLY)
Amphetamines, Ur Screen: NOT DETECTED
Barbiturates, Ur Screen: NOT DETECTED
Benzodiazepine, Ur Scrn: NOT DETECTED
Cannabinoid 50 Ng, Ur ~~LOC~~: NOT DETECTED
Cocaine Metabolite,Ur ~~LOC~~: NOT DETECTED
MDMA (Ecstasy)Ur Screen: NOT DETECTED
Methadone Scn, Ur: NOT DETECTED
Opiate, Ur Screen: NOT DETECTED
Phencyclidine (PCP) Ur S: NOT DETECTED
Tricyclic, Ur Screen: NOT DETECTED

## 2022-07-09 LAB — BASIC METABOLIC PANEL
Anion gap: 9 (ref 5–15)
BUN: 18 mg/dL (ref 8–23)
CO2: 26 mmol/L (ref 22–32)
Calcium: 8.8 mg/dL — ABNORMAL LOW (ref 8.9–10.3)
Chloride: 100 mmol/L (ref 98–111)
Creatinine, Ser: 1.11 mg/dL (ref 0.61–1.24)
GFR, Estimated: >60 mL/min (ref >60)
Glucose, Bld: 184 mg/dL — ABNORMAL HIGH (ref 70–99)
Potassium: 3.6 mmol/L (ref 3.5–5.1)
Sodium: 135 mmol/L (ref 135–145)

## 2022-07-09 LAB — CBC
HCT: 43 % (ref 39.0–52.0)
Hemoglobin: 14.4 g/dL (ref 13.0–17.0)
MCH: 32.8 pg (ref 26.0–34.0)
MCHC: 33.5 g/dL (ref 30.0–36.0)
MCV: 97.9 fL (ref 80.0–100.0)
Platelets: 222 10*3/uL (ref 150–400)
RBC: 4.39 MIL/uL (ref 4.22–5.81)
RDW: 11.8 % (ref 11.5–15.5)
WBC: 9.1 10*3/uL (ref 4.0–10.5)
nRBC: 0 % (ref 0.0–0.2)

## 2022-07-09 LAB — TROPONIN I (HIGH SENSITIVITY)
Troponin I (High Sensitivity): 9 ng/L (ref <18)
Troponin I (High Sensitivity): 8 ng/L (ref <18)

## 2022-07-09 LAB — APTT: aPTT: 31 seconds (ref 24–36)

## 2022-07-09 LAB — PROTIME-INR
INR: 1.1 (ref 0.8–1.2)
Prothrombin Time: 14 seconds (ref 11.4–15.2)

## 2022-07-09 LAB — BRAIN NATRIURETIC PEPTIDE: B Natriuretic Peptide: 21 pg/mL (ref 0.0–100.0)

## 2022-07-09 MED ORDER — BUPROPION HCL ER (XL) 150 MG PO TB24
150.0000 mg | ORAL_TABLET | Freq: Every day | ORAL | Status: DC
  Filled 2022-07-09: qty 1

## 2022-07-09 MED ORDER — VITAMIN D 25 MCG (1000 UNIT) PO TABS
1000.0000 [IU] | ORAL_TABLET | Freq: Every day | ORAL | Status: DC
  Administered 2022-07-10: 1000 [IU] via ORAL
  Filled 2022-07-09: qty 1

## 2022-07-09 MED ORDER — IOHEXOL 350 MG/ML SOLN
75.0000 mL | Freq: Once | INTRAVENOUS | Status: AC | PRN
  Administered 2022-07-09: 50 mL via INTRAVENOUS

## 2022-07-09 MED ORDER — ACETAMINOPHEN 325 MG PO TABS: 650.0000 mg | ORAL_TABLET | ORAL | Status: DC | PRN

## 2022-07-09 MED ORDER — ACETAMINOPHEN 650 MG RE SUPP: 650.0000 mg | RECTAL | Status: DC | PRN

## 2022-07-09 MED ORDER — ENOXAPARIN SODIUM 40 MG/0.4ML IJ SOSY
40.0000 mg | PREFILLED_SYRINGE | INTRAMUSCULAR | Status: DC
  Administered 2022-07-09: 40 mg via SUBCUTANEOUS
  Filled 2022-07-09: qty 0.4

## 2022-07-09 MED ORDER — HYDRALAZINE HCL 20 MG/ML IJ SOLN: 5.0000 mg | INTRAMUSCULAR | Status: DC | PRN

## 2022-07-09 MED ORDER — SODIUM CHLORIDE 0.9 % IV BOLUS
500.0000 mL | Freq: Once | INTRAVENOUS | Status: AC
  Administered 2022-07-09: 500 mL via INTRAVENOUS

## 2022-07-09 MED ORDER — SENNOSIDES-DOCUSATE SODIUM 8.6-50 MG PO TABS: 1.0000 | ORAL_TABLET | Freq: Every evening | ORAL | Status: DC | PRN

## 2022-07-09 MED ORDER — ALBUTEROL SULFATE HFA 108 (90 BASE) MCG/ACT IN AERS: 2.0000 | INHALATION_SPRAY | RESPIRATORY_TRACT | Status: DC | PRN

## 2022-07-09 MED ORDER — NITROGLYCERIN 0.4 MG SL SUBL: 0.4000 mg | SUBLINGUAL_TABLET | SUBLINGUAL | Status: DC | PRN

## 2022-07-09 MED ORDER — ROSUVASTATIN CALCIUM 10 MG PO TABS
20.0000 mg | ORAL_TABLET | Freq: Every day | ORAL | Status: DC
  Administered 2022-07-09 – 2022-07-10 (×2): 20 mg via ORAL
  Filled 2022-07-09 (×2): qty 1
  Filled 2022-07-09: qty 2

## 2022-07-09 MED ORDER — INSULIN ASPART 100 UNIT/ML IJ SOLN: 0.0000 [IU] | Freq: Three times a day (TID) | INTRAMUSCULAR | Status: DC

## 2022-07-09 MED ORDER — DM-GUAIFENESIN ER 30-600 MG PO TB12: 1.0000 | ORAL_TABLET | Freq: Two times a day (BID) | ORAL | Status: DC | PRN

## 2022-07-09 MED ORDER — ONDANSETRON HCL 4 MG/2ML IJ SOLN: 4.0000 mg | Freq: Three times a day (TID) | INTRAMUSCULAR | Status: DC | PRN

## 2022-07-09 MED ORDER — PANTOPRAZOLE SODIUM 40 MG PO TBEC
40.0000 mg | DELAYED_RELEASE_TABLET | Freq: Two times a day (BID) | ORAL | Status: DC
  Administered 2022-07-10: 40 mg via ORAL
  Filled 2022-07-09: qty 1

## 2022-07-09 MED ORDER — VITAMIN B-12 1000 MCG PO TABS
500.0000 ug | ORAL_TABLET | Freq: Every day | ORAL | Status: DC
  Administered 2022-07-10: 500 ug via ORAL
  Filled 2022-07-09: qty 1

## 2022-07-09 MED ORDER — MIRTAZAPINE 15 MG PO TABS: 15.0000 mg | ORAL_TABLET | Freq: Every day | ORAL | Status: DC

## 2022-07-09 MED ORDER — ASPIRIN 325 MG PO TBEC
325.0000 mg | DELAYED_RELEASE_TABLET | Freq: Once | ORAL | Status: AC
  Administered 2022-07-09: 325 mg via ORAL
  Filled 2022-07-09: qty 1

## 2022-07-09 MED ORDER — INSULIN ASPART 100 UNIT/ML IJ SOLN: 0.0000 [IU] | Freq: Every day | INTRAMUSCULAR | Status: DC

## 2022-07-09 MED ORDER — ASPIRIN 81 MG PO TBEC
81.0000 mg | DELAYED_RELEASE_TABLET | Freq: Every day | ORAL | Status: DC
  Administered 2022-07-10: 81 mg via ORAL
  Filled 2022-07-09: qty 1

## 2022-07-09 MED ORDER — ACETAMINOPHEN 160 MG/5ML PO SOLN: 650.0000 mg | ORAL | Status: DC | PRN

## 2022-07-09 MED ORDER — STROKE: EARLY STAGES OF RECOVERY BOOK: Freq: Once | Status: AC

## 2022-07-09 MED ORDER — VITAMIN C 500 MG PO TABS
1000.0000 mg | ORAL_TABLET | Freq: Every day | ORAL | Status: DC
  Administered 2022-07-10: 1000 mg via ORAL
  Filled 2022-07-09: qty 2

## 2022-07-09 NOTE — ED Notes (Signed)
Back from CT, speaking with MRI on phone. Alert, NAD, calm, interactive, speech clear, no changes.

## 2022-07-09 NOTE — ED Notes (Signed)
Wife arriving to [facility]

## 2022-07-09 NOTE — ED Notes (Addendum)
Admitting MD at [facility], pending MRI

## 2022-07-09 NOTE — ED Triage Notes (Signed)
Pt presents to the triage desk with the feeling of death and that he is having a MI.

## 2022-07-09 NOTE — ED Notes (Addendum)
Standing at [facility] using urinal, steady gait.

## 2022-07-09 NOTE — Progress Notes (Signed)
Follow up visit from [place]. Patient was engaged in conversation with medical staff. Will continue to follow up with this a fellow chaplain.

## 2022-07-09 NOTE — ED Notes (Signed)
Back from CT, alert, NAD, calm, interactive.  

## 2022-07-09 NOTE — ED Notes (Signed)
Called code stroke to [place], [place] per Dr. Corky Downs

## 2022-07-09 NOTE — Progress Notes (Addendum)
[place] notified of code stroke per call from Dr. Erlinda Hong with Neurology. Mariano Colon and Dr. Erlinda Hong connected to stroke cart 2. Cart was currently in [place] room. This RN asked CT tech to move cart into ED Rm 7 where pt has been room.  34 Dr. Erlinda Hong assessing pt at this time

## 2022-07-09 NOTE — H&P (Addendum)
History and Physical    Steven Brandt QJF:[id number] DOB: 07-May-1956 DOA: 07/09/2022  Referring MD/NP/PA:   PCP: Patient, No Pcp Per   Patient coming from:  The patient is coming from home.  At baseline, pt is independent for most of ADL.        Chief Complaint: Dizziness   HPI: Steven Brandt is a 66 y.o. 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 He started feeling dizzy and lightheaded around 12:30. At about 2:00 PM, he felt like he needed to have a bowel movement and afterwards he became very lightheaded.  He had tingling in all extremities. He states that he had sense of doom.  No difficult speaking.  No vision loss.  Patient has chronic hearing loss which has not changed.  Patient does not have chest pain, cough, shortness breath.  No nausea, vomiting, diarrhea or abdominal pain.  No symptoms of UTI. Of note, he had coronavirus infection in 2018 caused cardiomyopathy with EF less than 10 to 15%, was treated with beta-blocker and Entresto, cardiac function gradually improved, last 2D echo in 11/2021 60 to 65%.    Data reviewed independently and ED Course: pt was found to have WBC 9.1, troponin level 9, 8, GFR> 60, temperature normal, blood pressure 133/80, heart rate 56, 64, RR 19, oxygen saturation 99% on room air.  CT of head showed hypodensity at right cerebellum, concerning for age-indeterminate infarct. CTA of head and neck negative for LOV.  Chest x-ray negative. Patient is placed on telemetry bed for patient, Dr. Roda Shutters of neurology is consulted.  EKG: I have personally reviewed.  Sinus rhythm, QTc 462, LAD, ST depression in V4-V5.   Review of  Systems:   General: no fevers, chills, no body weight gain, has fatigue HEENT: no blurry vision, has chronic hearing loss, no sore throat Respiratory: no dyspnea, coughing, wheezing CV: no chest pain, no palpitations GI: no nausea, vomiting, abdominal pain, diarrhea, constipation GU: no dysuria, burning on urination, increased urinary frequency, hematuria  Ext: no leg edema Neuro: Has dizziness, tingly all extremities Skin: no rash, no skin tear. MSK: No muscle spasm, no deformity, no limitation of range of movement in spin Heme: No easy bruising.  Travel history: No recent long distant travel.   Allergy:  Allergies  Allergen Reactions   Biotene Dry Mouth [Antiseptic Mouth Rinse] Anaphylaxis   Celecoxib    Gabapentin     Other reaction(s): Drowsy   Molds & Smuts     Other reaction(s): Allergic rhinitis   Pregabalin     Other reaction(s): Depressive disorder    No past medical history on file.  Past Surgical History:  Procedure Laterality Date   LEFT HEART CATH AND CORONARY ANGIOGRAPHY N/A 12/05/2021   Procedure: LEFT HEART CATH AND CORONARY ANGIOGRAPHY;  Surgeon: Laurier Nancy, MD;  Location: [facility] INVASIVE CV LAB;  Service: Cardiovascular;  Laterality: N/A;    Social History:  reports that he has never smoked. He has never used smokeless tobacco. He reports that he does not currently use alcohol. He reports that he does not use drugs.  Family History:  Family History  Problem Relation Age of  Onset   Heart failure Mother    Heart disease Father      Prior to Admission medications   Medication Sig Start Date End Date Taking? Authorizing Provider  Ascorbic Acid (VITAMIN C) 1000 MG tablet Take 1,000 mg by mouth daily.    [provider]  aspirin 81 MG EC tablet Take 1 tablet (81 mg total) by mouth daily. Swallow whole. 12/06/21   Arnetha Courser, MD  buPROPion HCl ER, XL, 450 MG TB24 Take 150 mg by mouth daily.    [provider]  carvedilol (COREG) 6.25  MG tablet Take 1 tablet (6.25 mg total) by mouth 2 (two) times daily with a meal. 12/05/21   Arnetha Courser, MD  furosemide (LASIX) 20 MG tablet Take 20 mg by mouth.    [provider]  mirtazapine (REMERON) 15 MG tablet Take 15 mg by mouth at bedtime.    [provider]  nitroGLYCERIN (NITROSTAT) 0.4 MG SL tablet Place 1 tablet (0.4 mg total) under the tongue every 5 (five) minutes as needed for chest pain. 12/05/21   Arnetha Courser, MD  pantoprazole (PROTONIX) 40 MG tablet Take 1 tablet (40 mg total) by mouth 2 (two) times daily. 12/05/21   Arnetha Courser, MD  rosuvastatin (CRESTOR) 20 MG tablet Take 1 tablet (20 mg total) by mouth daily. 12/05/21   Arnetha Courser, MD  sacubitril-valsartan (ENTRESTO) 24-26 MG Take 1 tablet by mouth 2 (two) times daily. 12/06/21   Arnetha Courser, MD  vitamin B-12 (CYANOCOBALAMIN) 500 MCG tablet Take 500 mcg by mouth daily.    [provider]    Physical Exam: Vitals:   07/09/22 1630 07/09/22 1645 07/09/22 1700 07/09/22 1821  BP: 133/80 124/89 (!) 128/90   Pulse: 64 72 69   Resp: 13 18 18    Temp:    98.7 F (37.1 C)  TempSrc:    Oral  SpO2: 99% 97% 98%   Weight:      Height:       General: Not in acute distress HEENT:       Eyes: PERRL, EOMI, no scleral icterus.       ENT: No discharge from the ears and nose, no pharynx injection, no tonsillar enlargement.        Neck: No JVD, no bruit, no mass felt. Heme: No neck lymph node enlargement. Cardiac: S1/S2, RRR, No murmurs, No gallops or rubs. Respiratory: No rales, wheezing, rhonchi or rubs. GI: Soft, nondistended, nontender, no rebound pain, no organomegaly, BS present. GU: No hematuria Ext: No pitting leg edema bilaterally. 1+DP/PT pulse bilaterally. Musculoskeletal: No joint deformities, No joint redness or warmth, no limitation of ROM in spin. Skin: No rashes.  Neuro: Alert, oriented X3, cranial nerves II-XII grossly intact, moves all extremities normally. Muscle strength 5/5 in  all extremities, sensation to light touch intact.  Psych: Patient is not psychotic, no suicidal or hemocidal ideation.  Labs on Admission: I have personally reviewed following labs and imaging studies  CBC: Recent Labs  Lab 07/09/22 1254  WBC 9.1  HGB 14.4  HCT 43.0  MCV 97.9  PLT 222   Basic Metabolic Panel: Recent Labs  Lab 07/09/22 1254  NA 135  K 3.6  CL 100  CO2 26  GLUCOSE 184*  BUN 18  CREATININE 1.11  CALCIUM 8.8*   GFR: Estimated Creatinine Clearance: 70.5 mL/min (by C-G formula based on SCr of 1.11 mg/dL). Liver Function Tests: No results for input(s): "AST", "ALT", "ALKPHOS", "BILITOT", "PROT", "ALBUMIN" in the  last 168 hours. No results for input(s): "LIPASE", "AMYLASE" in the last 168 hours. No results for input(s): "AMMONIA" in the last 168 hours. Coagulation Profile: Recent Labs  Lab 07/09/22 1455  INR 1.1   Cardiac Enzymes: No results for input(s): "CKTOTAL", "CKMB", "CKMBINDEX", "TROPONINI" in the last 168 hours. BNP (last 3 results) No results for input(s): "PROBNP" in the last 8760 hours. HbA1C: No results for input(s): "HGBA1C" in the last 72 hours. CBG: Recent Labs  Lab 07/09/22 1304  GLUCAP 194*   Lipid Profile: No results for input(s): "CHOL", "HDL", "LDLCALC", "TRIG", "CHOLHDL", "LDLDIRECT" in the last 72 hours. Thyroid Function Tests: No results for input(s): "TSH", "T4TOTAL", "FREET4", "T3FREE", "THYROIDAB" in the last 72 hours. Anemia Panel: No results for input(s): "VITAMINB12", "FOLATE", "FERRITIN", "TIBC", "IRON", "RETICCTPCT" in the last 72 hours. Urine analysis: No results found for: "COLORURINE", "APPEARANCEUR", "LABSPEC", "PHURINE", "GLUCOSEU", "HGBUR", "BILIRUBINUR", "KETONESUR", "PROTEINUR", "UROBILINOGEN", "NITRITE", "LEUKOCYTESUR" Sepsis Labs: @LABRCNTIP (procalcitonin:4,lacticidven:4) )No results found for this or any previous visit (from the past 240 hour(s)).   Radiological Exams on Admission: CT ANGIO HEAD NECK  W WO CM  Result Date: 07/09/2022 CLINICAL DATA:  Stroke/TIA.  Determine embolic source EXAM: CT ANGIOGRAPHY HEAD AND NECK TECHNIQUE: Multidetector CT imaging of the head and neck was performed using the standard protocol during bolus administration of intravenous contrast. Multiplanar CT image reconstructions and MIPs were obtained to evaluate the vascular anatomy. Carotid stenosis measurements (when applicable) are obtained utilizing NASCET criteria, using the distal internal carotid diameter as the denominator. RADIATION DOSE REDUCTION: This exam was performed according to the departmental dose-optimization program which includes automated exposure control, adjustment of the mA and/or kV according to patient size and/or use of iterative reconstruction technique. CONTRAST:  21mL OMNIPAQUE IOHEXOL 350 MG/ML SOLN COMPARISON:  CT head 07/09/2022 FINDINGS: CTA NECK FINDINGS Aortic arch: Normal aortic arch. Bovine branching arch. Proximal great vessels widely patent. Right carotid system: Right carotid widely patent. Negative for stenosis or dissection Left carotid system: Left carotid widely patent. Negative for stenosis or dissection. Vertebral arteries: Both vertebral arteries are patent to the basilar without stenosis. Small vertebral arteries due to fetal origin of the posterior cerebral artery bilaterally. Skeleton: Cervical spondylosis. ACDF C4 through C6. No acute skeletal abnormality. Other neck: Negative for soft tissue mass or adenopathy. Mild enlargement of the thyroid. 12 mm right thyroid nodule. No further imaging necessary. (Ref: J Am Coll Radiol. 2015 Feb;12(2): 143-50). Upper chest: Lung apices clear bilaterally. Review of the MIP images confirms the above findings CTA HEAD FINDINGS Anterior circulation: Internal carotid artery widely patent bilaterally without stenosis. Anterior and middle cerebral arteries patent bilaterally without stenosis. Negative for aneurysm Posterior circulation: Both  vertebral arteries patent to the basilar without stenosis. PICA patent bilaterally. Basilar patent. Fetal origin of posterior cerebral artery bilaterally without stenosis. Superior cerebellar arteries patent bilaterally. Venous sinuses: Normal venous enhancement Anatomic variants: None Review of the MIP images confirms the above findings IMPRESSION: 1. Negative for carotid or vertebral artery stenosis in the neck. 2. No intracranial large vessel occlusion. 3. 12 mm right thyroid nodule. No further imaging necessary. Electronically Signed   By: 13/09/2021 M.D.   On: 07/09/2022 17:56   CT Head Wo Contrast  Result Date: 07/09/2022 CLINICAL DATA:  Provided history: Dizziness, persistent/recurrent, cardiac or vascular cause suspected. EXAM: CT HEAD WITHOUT CONTRAST TECHNIQUE: Contiguous axial images were obtained from the base of the skull through the vertex without intravenous contrast. RADIATION DOSE REDUCTION: This exam was performed according to the  departmental dose-optimization program which includes automated exposure control, adjustment of the mA and/or kV according to patient size and/or use of iterative reconstruction technique. COMPARISON:  No pertinent prior exams available for comparison. FINDINGS: Brain: No age advanced or lobar predominant parenchymal atrophy. 12 mm focus of hypodensity within the right cerebellar hemisphere (series 2, image 8) (series 4, image 53). 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 There is no acute intracranial hemorrhage. No demarcated cortical infarct. No extra-axial fluid collection. No evidence of an intracranial mass. No midline shift. Vascular: No hyperdense vessel. Skull: No fracture or aggressive osseous lesion. Sinuses/Orbits: No orbital mass or acute orbital finding. Postsurgical appearance of the paranasal sinuses. Mild mucosal thickening, and small mucous retention cysts, within the left maxillary sinus. Trace mucosal  thickening scattered within the bilateral ethmoidectomy cavities. Moderate mucosal thickening within the right frontoethmoidal recess. Mild mucosal thickening elsewhere within the right frontal sinus. IMPRESSION: 12 mm focus of abnormal hypodensity within the right cerebellar hemisphere. Although nonspecific, this likely reflects an age-indeterminate infarct, and a brain MRI should be considered for further evaluation. 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 Paranasal sinus disease, as described. Electronically Signed   By: Kellie Simmering D.O.   On: 07/09/2022 15:51   DG Chest 2 View  Result Date: 07/09/2022 CLINICAL DATA:  Chest pain EXAM: CHEST - 2 VIEW COMPARISON:  12/03/2021 FINDINGS: The heart size and mediastinal contours are within normal limits. Both lungs are clear. The visualized skeletal structures are unremarkable. IMPRESSION: No active cardiopulmonary disease. Electronically Signed   By: Ulyses Jarred M.D.   On: 07/09/2022 13:17      Assessment/Plan Principal Problem:   Stroke-like symptom Active Problems:   Essential hypertension   Cardiomyopathy ([place])   Controlled type 2 diabetes mellitus without complication, without long-term current use of insulin (HCC)   HLD (hyperlipidemia)   Interstitial lung disease ([place])   Depressive disorder   Assessment and Plan:  Stroke-like symptom: Etiology is not clear. CT of head showed hypodensity at right cerebellum, concerning for age-indeterminate infarct. CTA of head and neck negative for LOV. Consulted Dr. Erlinda Hong of neuro --> recommended stroke work-up.  -Placed on tele med bed for observation - Obtain MRI of brain - will hold oral Bp meds to allow permissive HTN in the setting of acute stroke for SBP>220 or dBP>110 - crestor - fasting lipid panel and HbA1c  - 2D transthoracic echocardiography  - swallowing screen. If fails, will get SLP - PT/OT consult - orthostatic  vitals - ASA 325 stat followed by ASA 81  Essential hypertension -Hold blood pressure medications for permissive hypertension -IV hydralazine SBP>220 or dBP>110  Cardiomyopathy ([place]): EF 10~15% in 2018, and EF 60-65% by 2d echo on 12/03/21.  No leg edema.  No shortness of breath.  CHF is compensated. -Hold Lasix, spironolactone and Entresto in the setting of possible acute stroke -Check  BNP  Controlled type 2 diabetes mellitus without complication, without long-term current use of insulin ([place]): Recent A1c 6.8.  Well-controlled.  Patient is not taking medications.  Blood sugar 184, 194 -Sliding scale insulin  HLD (hyperlipidemia) -Crestor   Interstitial lung disease ([place]): stable -Bronchodilators  Depressive disorder -Continue home medications    DVT ppx:  SQ Lovenox  Code Status: Full code  Family Communication:    Yes, patient's wife by phone  Disposition Plan:  Anticipate discharge back to previous environment  Consults called:  Dr. Erlinda Hong of neuro  Admission status  and Level of care: Telemetry Cardiac:  for obs    Dispo: The patient is from: Home              Anticipated d/c is to: Home              Anticipated d/c date is: 1 day              Patient currently is not medically stable to d/c.    Severity of Illness:  The appropriate patient status for this patient is OBSERVATION. Observation status is judged to be reasonable and necessary in order to provide the required intensity of service to ensure the patient's safety. The patient's presenting symptoms, physical exam findings, and initial radiographic and laboratory data in the context of their medical condition is felt to place them at decreased risk for further clinical deterioration. Furthermore, it is anticipated that the patient will be medically stable for discharge from the hospital within 2 midnights of admission.        Date of Service 07/09/2022    Lorretta Harp [facility]   If 7PM-7AM,  please contact night-coverage [email or web address] 07/09/2022, 6:43 PM

## 2022-07-09 NOTE — ED Notes (Signed)
Called code stroke to [place], [place]

## 2022-07-09 NOTE — ED Provider Notes (Signed)
[facility] Provider Note    Event Date/Time   First MD Initiated Contact with Patient 07/09/22 1451     (approximate)   History   Chest Pain   HPI  Mathias Bogacki is a 66 y.o. male with reported history of diabetes, dizziness, cardiomyopathy who presents with complaints of dizziness.  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  Around 2:00 he felt like he needed to have a bowel movement and afterwards he became very lightheaded and had a sense of doom.  He denies chest pain to me.  He states that he felt shaky all over     Physical Exam   Triage Vital Signs: ED Triage Vitals  Enc Vitals Group     BP 07/09/22 1252 124/81     Pulse Rate 07/09/22 1252 80     Resp 07/09/22 1252 19     Temp 07/09/22 1252 98.7 F (37.1 C)     Temp Source 07/09/22 1252 Oral     SpO2 07/09/22 1252 98 %     Weight 07/09/22 1253 86.2 kg (190 lb)     Height --      Head Circumference --      Peak Flow --      Pain Score 07/09/22 1253 0     Pain Loc --      Pain Edu? --      Excl. in [place]? --     Most recent vital signs: Vitals:   07/09/22 1615 07/09/22 1630  BP: 105/70 133/80  Pulse: (!) 56 64  Resp: 11 13  Temp:    SpO2: 99% 99%     General: Awake, no distress.  CV:  Good peripheral perfusion.  Regular rate and rhythm Resp:  Normal effort.  CTA bilaterally Abd:  No distention.  Soft nontender Other:  Cranial nerves II through XII are normal, moves all extremities equally, no weakness, no sensory changes reported   ED Results / Procedures / Treatments   Labs (all labs ordered are listed, but only abnormal results are displayed) Labs Reviewed  BASIC METABOLIC PANEL - Abnormal; Notable for the following components:      Result Value   Glucose, Bld 184 (*)    Calcium 8.8 (*)    All other components within normal limits  CBG MONITORING, ED - Abnormal; Notable for the following  components:   Glucose-Capillary 194 (*)    All other components within normal limits  CBC  TROPONIN I (HIGH SENSITIVITY)  TROPONIN I (HIGH SENSITIVITY)     EKG  ED ECG REPORT I, Lavonia Drafts, the attending physician, personally viewed and interpreted this ECG.  Date: 07/09/2022  Rhythm: normal sinus rhythm QRS Axis: normal Intervals: normal ST/T Wave abnormalities: Nonspecific changes Narrative Interpretation: no evidence of acute ischemia    RADIOLOGY Chest x-ray viewed interpreted by me, no acute abnormality    PROCEDURES:  Critical Care performed: yes  CRITICAL CARE Performed by: Lavonia Drafts   Total critical care time: 30 minutes  Critical care time was exclusive of separately billable procedures and treating other patients.  Critical care was necessary to treat or prevent imminent or life-threatening deterioration.  Critical care was time spent personally by me on the following activities: development of treatment plan with patient and/or surrogate as well as nursing, discussions with consultants, evaluation of patient's response to treatment, examination of patient, obtaining history from patient or surrogate,  ordering and performing treatments and interventions, ordering and review of laboratory studies, ordering and review of radiographic studies, pulse oximetry and re-evaluation of patient's condition.   Procedures   MEDICATIONS ORDERED IN ED: Medications  sodium chloride 0.9 % bolus 500 mL (0 mLs Intravenous Stopped 07/09/22 1640)     IMPRESSION / MDM / ASSESSMENT AND PLAN / ED COURSE  I reviewed the triage vital signs and the nursing notes. Patient's presentation is most consistent with acute presentation with potential threat to life or bodily function.  Patient presents with lightheadedness and dizziness.  He does have a history of cardiomyopathy.  Denies decreased fluid intake or increase in diuretic.  Differential includes ACS, arrhythmia,  dehydration, electrolyte abnormality.  No focal weakness to suggest CVA  We will obtain labs, give 500 cc bolus, obtain CT head, chest x-ray and reevaluate  Lab work is reassuring, high sensitive troponin is negative x2.  Electrolytes are unremarkable.  CT head demonstrates 12 mm focus of abnormal density in the cerebellum, this could represent an acute infarct.  Code stroke activated given onset of symptoms sometime between noon and 1:00.  ----------------------------------------- 5:15 PM on 07/09/2022 ----------------------------------------- Patient seen by neurologist who feels unlikely symptoms are related to cerebellar stroke but recommends admission for stroke work-up and further evaluation, I have consulted the hospitalist service for admission      FINAL CLINICAL IMPRESSION(S) / ED DIAGNOSES   Final diagnoses:  Near syncope     Rx / DC Orders   ED Discharge Orders     None        Note:  This document was prepared using Dragon voice recognition software and may include unintentional dictation errors.   Jene Every, MD 07/09/22 [phone]

## 2022-07-09 NOTE — Consult Note (Signed)
[facility] Telemedicine Consult   Requesting Provider: Dr. Cyril Loosen  Chief Complaint: dizziness  HPI:  66 year old male with history of hypertension, borderline diabetes, CHF in 2018, cervical stenosis status post cervical fusion presented to ED for dizziness, impending doom, whole body tingling and weakness.  He stated that around 12 to 12:30 PM he was in the bathroom and felt dizzy, lightheadedness, denies vertigo.  He got up felt even worse with whole body tingling sensation and whole body weakness.  Denies nausea vomiting, denies chest pain or diaphoresis.  He came to ED for evaluation.  Around 1 to 1:30 PM, he again felt worse with whole body tremulous, impending doom.  SBP 100-120s, glucose 180-190s.  CT head done around 3:30 PM showed hypodensity at right cerebellum, concerning for age-indeterminate infarct.  Around 4:20 PM code stroke was called.  During my interaction with the patient, he right he felt much better, nearly at the baseline, no focal neurologic deficit.  He stated that early September he had a similar episode but did not have it checked.  But he denies any history of stroke.  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  He is not taking aspirin at home, although aspirin listed as home medication.  LKW: 12-12 30 PM tpa given?: No, symptom resolved IR Thrombectomy? No, symptom resolved Modified Rankin Scale: 0-Completely asymptomatic and back to baseline post- stroke   Exam: Vitals:   07/09/22 1615 07/09/22 1630  BP: 105/70 133/80  Pulse: (!) 56 64  Resp: 11 13  Temp:    SpO2: 99% 99%     Temp:  [98.7 F (37.1 C)] 98.7 F (37.1 C) (11/01 1252) Pulse Rate:  [56-80] 64 (11/01 1630) Resp:  [11-19] 13 (11/01 1630) BP: (105-133)/(69-86) 133/80 (11/01 1630) SpO2:  [97 %-100 %] 99 % (11/01  1630) Weight:  [86.2 kg] 86.2 kg (11/01 1253)  General - Well nourished, well developed, in no apparent distress.  Ophthalmologic - fundi not visualized due to noncooperation.  Cardiovascular - Regular rhythm and rate.  Neuro - awake, alert, eyes open, orientated to age, place, time and people. No aphasia, fluent language, following all simple commands. Able to name and repeat and read. No gaze palsy, tracking bilaterally, visual field full, PERRL. No facial droop.  No nystagmus or disconjugate gaze.  Tongue midline. Bilateral UEs 5/5, no drift. Bilaterally LEs 5/5, no drift. Sensation symmetrical bilaterally, b/l FTN and HTS intact, gait not tested.    NIH Stroke Scale = 0  Imaging Reviewed:  CT Head Wo Contrast  Result Date: 07/09/2022 CLINICAL DATA:  Provided history: Dizziness, persistent/recurrent, cardiac or vascular cause suspected. EXAM: CT HEAD WITHOUT CONTRAST TECHNIQUE: Contiguous axial images were obtained from the base of the skull through the vertex without intravenous contrast. RADIATION DOSE REDUCTION: This exam was performed according to the departmental dose-optimization program which includes automated exposure control, adjustment of the mA and/or kV according to patient size and/or use of iterative reconstruction technique. COMPARISON:  No pertinent prior exams available for comparison. FINDINGS: Brain: No age advanced or lobar predominant parenchymal atrophy. 12 mm focus of hypodensity within the right cerebellar hemisphere (series 2, image 8) (series 4, image 53). 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 There is no acute intracranial hemorrhage. No demarcated cortical infarct. No extra-axial fluid collection. No evidence of an intracranial mass. No  midline shift. Vascular: No hyperdense vessel. Skull: No fracture or aggressive osseous lesion. Sinuses/Orbits: No orbital mass or acute orbital finding. Postsurgical appearance of the  paranasal sinuses. Mild mucosal thickening, and small mucous retention cysts, within the left maxillary sinus. Trace mucosal thickening scattered within the bilateral ethmoidectomy cavities. Moderate mucosal thickening within the right frontoethmoidal recess. Mild mucosal thickening elsewhere within the right frontal sinus. IMPRESSION: 12 mm focus of abnormal hypodensity within the right cerebellar hemisphere. Although nonspecific, this likely reflects an age-indeterminate infarct, and a brain MRI should be considered for further evaluation. 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 Paranasal sinus disease, as described. Electronically Signed   By: Kellie Simmering D.O.   On: 07/09/2022 15:51   DG Chest 2 View  Result Date: 07/09/2022 CLINICAL DATA:  Chest pain EXAM: CHEST - 2 VIEW COMPARISON:  12/03/2021 FINDINGS: The heart size and mediastinal contours are within normal limits. Both lungs are clear. The visualized skeletal structures are unremarkable. IMPRESSION: No active cardiopulmonary disease. Electronically Signed   By: Ulyses Jarred M.D.   On: 07/09/2022 13:17     Labs reviewed in epic and pertinent values follow: Creatinine 1.11, platelet 222, hemoglobin 14.4  Assessment:  66 year old male with history of hypertension, borderline diabetes, CHF in 2018, cervical stenosis status post cervical fusion presented to ED for dizziness, impending doom, whole body tingling and weakness.  Patient denies any vertigo, nausea vomiting.  Time onset around 12 to 12:30 PM.  Currently symptoms resolved NIH score 0.  CT no acute abnormality but right cerebellum age-indeterminate hypodensity.  Patient not at TNK candidate given symptom resolved, not IR candidate given symptom resolved NIH score 0.  Etiology for patient symptoms concerning for presyncope, however given CT showing right cerebellar hypodensity, will need to further  investigation.  Recommendations:  Admit by hospitalist service and continue further stroke/syncope work up  Frequent neuro checks Telemetry monitoring MRI brain  CTA head and neck Echocardiogram  UDS, fasting lipid panel and HgbA1C Orthostatic vitals PT/OT/speech consult ASA 325 stat followed by ASA 81 Further evaluation as per inpatient neurology recommendations  Discussed with Dr. Corky Downs ED physician Will follow   Consult Participants: Patient, RN, stroke response nurse and me Location of the provider: [facility] Location of the patient: [place]  Time Code Stroke Page received: 4:22 PM Time neurologist arrived: 4:25 PM Time NIHSS completed: 4:40 PM  This consult was provided via telemedicine with 2-way video and audio communication. The patient/family was informed that care would be provided in this way and agreed to receive care in this manner.   This patient is receiving care for possible acute neurological changes. There was 60 minutes of care by this provider at the time of service, including time for direct evaluation via telemedicine, review of medical records, imaging studies and discussion of findings with providers, the patient and/or family.  Rosalin Hawking, MD PhD Stroke Neurology 07/09/2022 5:03 PM

## 2022-07-09 NOTE — ED Notes (Signed)
Tele neuro assessment in progress, pt alert, NAD, calm, interactive, participatory

## 2022-07-09 NOTE — ED Notes (Signed)
EDP at BS 

## 2022-07-10 ENCOUNTER — Other Ambulatory Visit: Payer: Self-pay

## 2022-07-10 DIAGNOSIS — R2990 Unspecified symptoms and signs involving the nervous system: Secondary | ICD-10-CM | POA: Diagnosis not present

## 2022-07-10 LAB — ECHOCARDIOGRAM COMPLETE
Area-P 1/2: 4.06 cm2
Height: 67 in
S' Lateral: 2.7 cm
Weight: 3040 oz

## 2022-07-10 LAB — URINALYSIS, ROUTINE W REFLEX MICROSCOPIC
Bacteria, UA: NONE SEEN
Bilirubin Urine: NEGATIVE
Glucose, UA: NEGATIVE mg/dL
Ketones, ur: NEGATIVE mg/dL
Nitrite: NEGATIVE
Protein, ur: 30 mg/dL — AB
RBC / HPF: >50 RBC/hpf — ABNORMAL HIGH (ref 0–5)
Specific Gravity, Urine: 1.027 (ref 1.005–1.030)
Squamous Epithelial / HPF: NONE SEEN (ref 0–5)
WBC, UA: >50 WBC/hpf — ABNORMAL HIGH (ref 0–5)
pH: 5 (ref 5.0–8.0)

## 2022-07-10 LAB — HEMOGLOBIN A1C
Hgb A1c MFr Bld: 7.2 % — ABNORMAL HIGH (ref 4.8–5.6)
Mean Plasma Glucose: 159.94 mg/dL

## 2022-07-10 LAB — LIPID PANEL
Cholesterol: 166 mg/dL (ref 0–200)
HDL: 33 mg/dL — ABNORMAL LOW (ref >40)
LDL Cholesterol: 104 mg/dL — ABNORMAL HIGH (ref 0–99)
Total CHOL/HDL Ratio: 5 RATIO
Triglycerides: 145 mg/dL (ref <150)
VLDL: 29 mg/dL (ref 0–40)

## 2022-07-10 LAB — GLUCOSE, CAPILLARY
Glucose-Capillary: 126 mg/dL — ABNORMAL HIGH (ref 70–99)
Glucose-Capillary: 146 mg/dL — ABNORMAL HIGH (ref 70–99)

## 2022-07-10 MED ORDER — BUPROPION HCL ER (XL) 150 MG PO TB24
450.0000 mg | ORAL_TABLET | Freq: Every day | ORAL | Status: DC
  Administered 2022-07-10: 450 mg via ORAL
  Filled 2022-07-10: qty 3

## 2022-07-10 MED ORDER — SODIUM CHLORIDE 0.9 % IV SOLN: INTRAVENOUS | Status: DC

## 2022-07-10 MED ORDER — SPIRONOLACTONE 25 MG PO TABS
12.5000 mg | ORAL_TABLET | Freq: Every day | ORAL | 0 refills | Status: DC
  Filled 2022-07-10: qty 15, 30d supply, fill #0

## 2022-07-10 NOTE — Progress Notes (Signed)
Subjective: Feels better  Exam: Vitals:   07/10/22 0723 07/10/22 1119  BP: 104/66 101/74  Pulse: 76 64  Resp: 18 17  Temp: 97.7 F (36.5 C) 97.8 F (36.6 C)  SpO2: 96% 96%   Gen: In bed, NAD Resp: non-labored breathing, no acute distress Abd: soft, nt  Neuro: MS: Awake, alert, interactive and appropriate CN: Face symmetric, EOMI Motor: No drift Sensory: Intact to light touch  Pertinent Labs: LDL 104  Impression: 66 year old male who presented with lightheadedness that lasted for about 3 hours.  This started in the setting of having a bowel movement, but persisted for long after I would expect a vagal response to have resolved.  He was mildly orthostatic, and I think that this could have played a role.  Recommendations: 1) aspirin 81 mg daily for old stroke seen on MRI 2) atorvastatin 40 mg nightly for the same 3) no further testing from a neurological standpoint, please call with further questions or concerns.  Roland Rack, MD [facility] [phone]  If 7pm- 7am, please page neurology on call as listed in [place].

## 2022-07-10 NOTE — Plan of Care (Signed)
  Problem: Education: Goal: Knowledge of disease or condition will improve Outcome: Progressing   Problem: Education: Goal: Knowledge of secondary prevention will improve (MUST DOCUMENT ALL) Outcome: Progressing   Problem: Clinical Measurements: Goal: Respiratory complications will improve Outcome: Progressing   Problem: Clinical Measurements: Goal: Cardiovascular complication will be avoided Outcome: Progressing   Problem: Activity: Goal: Risk for activity intolerance will decrease Outcome: Progressing   Problem: Elimination: Goal: Will not experience complications related to bowel motility Outcome: Progressing   Problem: Elimination: Goal: Will not experience complications related to urinary retention Outcome: Progressing   Problem: Pain Managment: Goal: General experience of comfort will improve Outcome: Progressing

## 2022-07-10 NOTE — Evaluation (Addendum)
Occupational Therapy Evaluation Patient Details Name: Steven Brandt MRN: [id number] DOB: [birth year] Today's Date: 07/10/2022   History of Present Illness 66 y.o. 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   Clinical Impression   Pt was seen for OT evaluation this date. Prior to hospital admission, pt was independent and working full time as a Orthoptist. Pt lives with his spouse in a 1 story home with 1 STE. Pt presents to acute OT demonstrating near baseline ADL function and no assist required for ADL, transfers, and mobility. No unilateral deficits appreciated, pt endorses feeling nearly back to baseline. Pt endorses mild STM deficits since September but no acute cognitive, visual, or speech deficits appreciated during assessment. Pt endorses mild blurry vision yesterday but resolved today. Was technically orthostatic and very slightly symptomatic with standing but functionally not limited and symptoms resolved quickly while in standing (supine 114/78, sitting 106/79, standing 92/80, standing 93/72). No acute OT needs at this time. Will sign off. Care team notified.      Recommendations for follow up therapy are one component of a multi-disciplinary discharge planning process, led by the attending physician.  Recommendations may be updated based on patient status, additional functional criteria and insurance authorization.   Follow Up Recommendations  No OT follow up    Assistance Recommended at Discharge PRN  Patient can return home with the following      Functional Status Assessment  Patient has not had a recent decline in their functional status  Equipment Recommendations  None recommended by OT     Recommendations for Other Services       Precautions / Restrictions Precautions Precaution Comments: minimal falls risk Restrictions Weight Bearing Restrictions: No      Mobility Bed Mobility Overal bed mobility: Independent                  Transfers Overall transfer level: Independent Equipment used: None                      Balance Overall balance assessment: Mild deficits observed, not formally tested                                         ADL either performed or assessed with clinical judgement   ADL Overall ADL's : Independent                                             Vision Baseline Vision/History: 0 No visual deficits Patient Visual Report: No change from baseline;Other (comment) (Pt endorses some mild blurry vision yesterday but back to baseline no deficits today)       Perception     Praxis      Pertinent Vitals/Pain Pain Assessment Pain Assessment: No/denies pain     Hand Dominance     Extremity/Trunk Assessment Upper Extremity Assessment Upper Extremity Assessment: Overall WFL for tasks assessed (no sensory, coordination, or strength deficits appreciated)   Lower Extremity Assessment Lower Extremity Assessment: Overall WFL for tasks  assessed (no sensory, coordination, or strength deficits appreciated)   Cervical / Trunk Assessment Cervical / Trunk Assessment: Normal   Communication Communication Communication: Other (comment) (wears heading aides)   Cognition Arousal/Alertness: Awake/alert Behavior During Therapy: WFL for tasks assessed/performed Overall Cognitive Status: Within Functional Limits for tasks assessed                                 General Comments: Pt endorses some mild STM deficits since September impacting conversational details only at times     General Comments  orthostatics taken: Was technically orthostatic and very slightly symptomatic but  functionally not limited (supine 114/78, sitting 106/79, standing 92/80, standing 69min 93/72).    Exercises Other Exercises Other Exercises: Pt ambulated around nurses station with SBA-CGA and no overt LOB, engaging in dual task cognitive challenge without difficulty   Shoulder Instructions      Home Living Family/patient expects to be discharged to:: Private residence Living Arrangements: Spouse/significant other Available Help at Discharge: Family;Available 24 hours/day Type of Home: House Home Access: Stairs to enter [employer] of Steps: 1 Entrance Stairs-Rails: None Home Layout: One level     Bathroom Shower/Tub: Occupational psychologist: Standard     Home Equipment: None          Prior Functioning/Environment Prior Level of Function : Independent/Modified Independent;Working/employed;Driving             Mobility Comments: indep, no falls ADLs Comments: Indep with ADL and IADL, working full time as a Clinical biochemist with [employer]        OT Problem List: Impaired balance (sitting and/or standing);Cardiopulmonary status limiting activity      OT Treatment/Interventions:      OT Goals(Current goals can be found in the care plan section) Acute Rehab OT Goals Patient Stated Goal: return to PLOF OT Goal Formulation: All assessment and education complete, DC therapy  OT Frequency:      Co-evaluation              AM-PAC OT "6 Clicks" Daily Activity     Outcome Measure Help from another person eating meals?: None Help from another person taking care of personal grooming?: None Help from another person toileting, which includes using toliet, bedpan, or urinal?: None Help from another person bathing (including washing, rinsing, drying)?: None Help from another person to put on and taking off regular upper body clothing?: None Help from another person to put on and taking off regular lower body clothing?: None 6 Click Score: 24    End of Session Equipment Utilized During Treatment: Gait belt Nurse Communication: Mobility status  Activity Tolerance: Patient tolerated treatment well Patient left: in chair;with call bell/phone within reach  OT Visit Diagnosis: Other abnormalities of gait and mobility (R26.89)                Time: 1607-3710 OT Time Calculation (min): 26 min Charges:  OT General Charges $OT Visit: 1 Visit OT Evaluation $OT Eval Low Complexity: 1 Low  Ardeth Perfect., MPH, MS, OTR/L ascom [phone] 07/10/22, 9:24 AM

## 2022-07-10 NOTE — Progress Notes (Signed)
       CROSS COVER NOTE  NAME: Haile Bosler MRN: [id number] DOB : November 29, 1955 ATTENDING PHYSICIAN: Sidney Ace, MD    Date of Service   07/10/2022   HPI/Events of Note   Notified of patient report of penile burning after urinating and sensation of being unable to urinate. Bladder scan shows 15mL in bladder per nursing staff. Last void time 0500.  Interventions   Assessment/Plan:  Dysuria UA     This document was prepared using Dragon voice recognition software and may include unintentional dictation errors.  Neomia Glass DNP, MBA, FNP-BC Nurse Practitioner [facility] Pager [phone]

## 2022-07-10 NOTE — Progress Notes (Signed)
Brief note.  Full note to follow  66 year old male placed in observation for possible acute CVA.  MRI and CT reassuring.  Pending therapy evaluations and neurology consult.  Possible discharge later this afternoon  Ralene Muskrat MD

## 2022-07-10 NOTE — Progress Notes (Signed)
     [place] REFERRAL        Occupational Therapy * Physical Therapy * Speech Therapy                           DATE 01/11/1956  PATIENT NAME Steven Brandt   PATIENT MRN [id number]       DIAGNOSIS/DIAGNOSIS CODE R29.90 stroke  DATE OF DISCHARGE: 07/10/22           Dear Provider (Name: [facility] outpatient __  Fax: [phone]   I certify that I have examined this patient and that occupational/physical/speech therapy is necessary on an outpatient basis.    The patient has expressed interest in completing their recommended course of therapy at your  location.  Once a formal order from the patient's primary care physician has been obtained, please contact him/her to schedule an appointment for evaluation at your earliest convenience.   [facility] ]  Physical Therapy Evaluate and Treat           The patient's primary care physician (listed above) must furnish and be responsible for a formal order such that the recommended services may be furnished while under the primary physician's care, and that the plan of care will be established and reviewed every 30 days (or more often if condition necessitates).

## 2022-07-10 NOTE — Discharge Summary (Signed)
Physician Discharge Summary  Steven Brandt [id number] DOB: 1956-06-30 DOA: 07/09/2022  PCP: Patient, No Pcp Per  Admit date: 07/09/2022 Discharge date: 07/10/2022  Admitted From: Home Disposition:  Home  Recommendations for Outpatient Follow-up:  Follow up with PCP in 1-2 weeks Follow up with cardiology in [facility]:No Equipment/Devices:None   Discharge Condition:Stable  CODE STATUS:FULL  Diet recommendation: Heart healthy  Brief/Interim Summary: 66 y.o. 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 He started feeling dizzy and lightheaded around 12:30. At about 2:00 PM, he felt like he needed to have a bowel movement and afterwards he became very lightheaded.  He had tingling in all extremities. He states that he had sense of doom.  No difficult speaking.  No vision loss.  Patient has chronic hearing loss which has not changed.  Patient does not have chest pain, cough, shortness breath.  No nausea, vomiting, diarrhea or abdominal pain.  No symptoms of UTI. Of note, he had coronavirus infection in 2018 caused cardiomyopathy with EF less than 10 to 15%, was treated with beta-blocker and Entresto, cardiac function gradually improved, last 2D echo in 11/2021 60 to 65%.    Seen in consultation by neurology.  No acute CVA noted on imaging.  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  Patient appropriate for discharge home.  Will recommend resumption of home aspirin 81 mg daily.  Will recommend reduction of Aldactone dose to 12.5 mg daily.  Etiology of events preceding hospitalization is unclear.  Suspect presyncope in the setting of overdiuresis.  Will recommend  reduction in Aldactone dose to 12.5 mg daily.  Patient will follow-up with his established cardiologist in [place] postdischarge for further medication management.    Discharge Diagnoses:  Principal Problem:   Stroke-like symptom Active Problems:   Essential hypertension   Cardiomyopathy ([place])   Controlled type 2 diabetes mellitus without complication, without long-term current use of insulin (HCC)   HLD (hyperlipidemia)   Interstitial lung disease ([facility])   Depressive disorder  Strokelike symptoms Etiology is not entirely clear.  CT head with hypodensity at right cerebellum.  MRI did show evidence of old stroke but not new.  Neurology in agreement.  Will discharge home at this time.  Outpatient referral to [facility] rehab services.  Resumption of home aspirin and statin.  Reduction of Aldactone dose to 12.5 mg daily with further medication management defer to outpatient cardiology.  Discharge Instructions  Discharge Instructions     Ambulatory referral to Physical Therapy   Complete by: As directed    Diet - low sodium heart healthy   Complete by: As directed    Increase activity slowly   Complete by: As directed       Allergies as of 07/10/2022       Reactions   Biotene Dry Mouth [antiseptic Mouth Rinse] Anaphylaxis   Celecoxib    Gabapentin    Other reaction(s): Drowsy   Molds & Smuts    Other reaction(s): Allergic rhinitis   Pregabalin    Other reaction(s): Depressive disorder        Medication List     STOP taking these medications  Entresto 24-26 MG Generic drug: sacubitril-valsartan   Entresto 97-103 MG Generic drug: sacubitril-valsartan       TAKE these medications    aspirin EC 81 MG tablet Take 1 tablet (81 mg total) by mouth daily. Swallow whole.   buPROPion HCl ER (XL) 450 MG Tb24 Take 450 mg by mouth daily.   carvedilol 12.5 MG tablet Commonly known as: COREG Take 12.5 mg by mouth 2 (two) times daily with a meal. What changed: Another  medication with the same name was removed. Continue taking this medication, and follow the directions you see here.   cholecalciferol 25 MCG (1000 UNIT) tablet Commonly known as: VITAMIN D3 Take 1,000 Units by mouth daily.   cyanocobalamin 500 MCG tablet Commonly known as: VITAMIN B12 Take 500 mcg by mouth daily.   furosemide 20 MG tablet Commonly known as: LASIX Take 20 mg by mouth every other day.   mirtazapine 15 MG tablet Commonly known as: REMERON Take 15 mg by mouth at bedtime.   nitroGLYCERIN 0.4 MG SL tablet Commonly known as: NITROSTAT Place 1 tablet (0.4 mg total) under the tongue every 5 (five) minutes as needed for chest pain.   pantoprazole 40 MG tablet Commonly known as: PROTONIX Take 1 tablet (40 mg total) by mouth 2 (two) times daily.   rosuvastatin 20 MG tablet Commonly known as: CRESTOR Take 1 tablet (20 mg total) by mouth daily.   spironolactone 25 MG tablet Commonly known as: ALDACTONE Take 0.5 tablets (12.5 mg total) by mouth daily. What changed: how much to take   vitamin C 1000 MG tablet Take 1,000 mg by mouth daily.        Allergies  Allergen Reactions   Biotene Dry Mouth [Antiseptic Mouth Rinse] Anaphylaxis   Celecoxib    Gabapentin     Other reaction(s): Drowsy   Molds & Smuts     Other reaction(s): Allergic rhinitis   Pregabalin     Other reaction(s): Depressive disorder    Consultations: Neurology   Procedures/Studies: ECHOCARDIOGRAM COMPLETE  Result Date: 07/10/2022    ECHOCARDIOGRAM REPORT   Patient Name:   Steven Brandt Date of Exam: 07/09/2022 Medical Rec #:  [id number]        Height:       67.0 in Accession #:    [id number]       Weight:       190.0 lb Date of Birth:  1956/07/30         BSA:          1.979 m Patient Age:    60 years         BP:           112/84 mmHg Patient Gender: M                HR:           81 bpm. Exam Location:  [facility] Procedure: 2D Echo, Cardiac Doppler and Color Doppler Indications:     G45.9 TIA   History:         Patient has prior history of Echocardiogram examinations, most                  recent 12/03/2021.  Sonographer:     Cresenciano Lick RDCS Referring Phys:  [id number] Steven Brandt Diagnosing Phys: Yolonda Kida MD IMPRESSIONS  1. Left ventricular ejection fraction, by estimation, is 50 to 55%. The left ventricle has low normal function. The left ventricle has no regional wall  motion abnormalities. Left ventricular diastolic parameters are consistent with Grade I diastolic dysfunction (impaired relaxation).  2. Right ventricular systolic function is normal. The right ventricular size is normal.  3. The mitral valve is normal in structure. No evidence of mitral valve regurgitation.  4. The aortic valve is normal in structure. Aortic valve regurgitation is not visualized. FINDINGS  Left Ventricle: Left ventricular ejection fraction, by estimation, is 50 to 55%. The left ventricle has low normal function. The left ventricle has no regional wall motion abnormalities. The left ventricular internal cavity size was normal in size. There is borderline left ventricular hypertrophy. Left ventricular diastolic parameters are consistent with Grade I diastolic dysfunction (impaired relaxation). Right Ventricle: The right ventricular size is normal. No increase in right ventricular wall thickness. Right ventricular systolic function is normal. Left Atrium: Left atrial size was normal in size. Right Atrium: Right atrial size was normal in size. Pericardium: There is no evidence of pericardial effusion. Mitral Valve: The mitral valve is normal in structure. No evidence of mitral valve regurgitation. Tricuspid Valve: The tricuspid valve is normal in structure. Tricuspid valve regurgitation is trivial. Aortic Valve: The aortic valve is normal in structure. Aortic valve regurgitation is not visualized. Pulmonic Valve: The pulmonic valve was normal in structure. Pulmonic valve regurgitation is not visualized. Aorta:  The ascending aorta was not well visualized. IAS/Shunts: No atrial level shunt detected by color flow Doppler.  LEFT VENTRICLE PLAX 2D LVIDd:         4.40 cm   Diastology LVIDs:         2.70 cm   LV e' medial:    4.13 cm/s LV PW:         1.20 cm   LV E/e' medial:  9.8 LV IVS:        1.10 cm   LV e' lateral:   7.18 cm/s LVOT diam:     2.40 cm   LV E/e' lateral: 5.6 LV SV:         66 LV SV Index:   33 LVOT Area:     4.52 cm  RIGHT VENTRICLE RV Basal diam:  3.90 cm RV S prime:     16.05 cm/s TAPSE (M-mode): 2.5 cm LEFT ATRIUM             Index        RIGHT ATRIUM           Index LA diam:        4.30 cm 2.17 cm/m   RA Area:     15.60 cm LA Vol (A2C):   32.5 ml 16.43 ml/m  RA Volume:   45.30 ml  22.89 ml/m LA Vol (A4C):   30.3 ml 15.31 ml/m LA Biplane Vol: 31.6 ml 15.97 ml/m  AORTIC VALVE LVOT Vmax:   68.90 cm/s LVOT Vmean:  47.400 cm/s LVOT VTI:    0.145 m  AORTA Ao Root diam: 4.10 cm Ao Asc diam:  3.60 cm MITRAL VALVE MV Area (PHT): 4.06 cm    SHUNTS MV Decel Time: 187 msec    Systemic VTI:  0.15 m MV E velocity: 40.40 cm/s  Systemic Diam: 2.40 cm MV A velocity: 67.60 cm/s MV E/A ratio:  0.60 Dwayne D Callwood MD Electronically signed by Yolonda Kida MD Signature Date/Time: 07/10/2022/8:29:57 AM    Final    MR BRAIN WO CONTRAST  Result Date: 07/09/2022 CLINICAL DATA:  Follow-up examination for stroke. EXAM: MRI HEAD WITHOUT CONTRAST TECHNIQUE: Multiplanar, multiecho pulse  sequences of the brain and surrounding structures were obtained without intravenous contrast. COMPARISON:  Prior CTs from earlier the same day. FINDINGS: Brain: Cerebral volume within normal limits. No significant cerebral white matter disease for age. Small remote right occipital cortical infarct. Additional small remote right cerebellar infarcts noted as well. No acute or subacute ischemia. Gray-white matter differentiation maintained. No acute or chronic intracranial blood products. No mass lesion, mass effect or midline shift. No  hydrocephalus or extra-axial fluid collection. Pituitary gland and suprasellar region within normal limits. Vascular: Major intracranial vascular flow voids are maintained. Skull and upper cervical spine: Craniocervical junction within normal limits. Bone marrow signal intensity normal. No scalp soft tissue abnormality. Sinuses/Orbits: Globes and orbital soft tissues within normal limits. Scattered mucosal thickening noted about the frontoethmoidal and maxillary sinuses. No significant mastoid effusion. Other: None. IMPRESSION: 1. No acute intracranial abnormality. 2. Small remote right occipital and right cerebellar infarcts. Electronically Signed   By: Jeannine Boga M.D.   On: 07/09/2022 23:00   CT ANGIO HEAD NECK W WO CM  Result Date: 07/09/2022 CLINICAL DATA:  Stroke/TIA.  Determine embolic source EXAM: CT ANGIOGRAPHY HEAD AND NECK TECHNIQUE: Multidetector CT imaging of the head and neck was performed using the standard protocol during bolus administration of intravenous contrast. Multiplanar CT image reconstructions and MIPs were obtained to evaluate the vascular anatomy. Carotid stenosis measurements (when applicable) are obtained utilizing NASCET criteria, using the distal internal carotid diameter as the denominator. RADIATION DOSE REDUCTION: This exam was performed according to the departmental dose-optimization program which includes automated exposure control, adjustment of the mA and/or kV according to patient size and/or use of iterative reconstruction technique. CONTRAST:  30mL OMNIPAQUE IOHEXOL 350 MG/ML SOLN COMPARISON:  CT head 07/09/2022 FINDINGS: CTA NECK FINDINGS Aortic arch: Normal aortic arch. Bovine branching arch. Proximal great vessels widely patent. Right carotid system: Right carotid widely patent. Negative for stenosis or dissection Left carotid system: Left carotid widely patent. Negative for stenosis or dissection. Vertebral arteries: Both vertebral arteries are patent to the  basilar without stenosis. Small vertebral arteries due to fetal origin of the posterior cerebral artery bilaterally. Skeleton: Cervical spondylosis. ACDF C4 through C6. No acute skeletal abnormality. Other neck: Negative for soft tissue mass or adenopathy. Mild enlargement of the thyroid. 12 mm right thyroid nodule. No further imaging necessary. (Ref: J Am Coll Radiol. 2015 Feb;12(2): 143-50). Upper chest: Lung apices clear bilaterally. Review of the MIP images confirms the above findings CTA HEAD FINDINGS Anterior circulation: Internal carotid artery widely patent bilaterally without stenosis. Anterior and middle cerebral arteries patent bilaterally without stenosis. Negative for aneurysm Posterior circulation: Both vertebral arteries patent to the basilar without stenosis. PICA patent bilaterally. Basilar patent. Fetal origin of posterior cerebral artery bilaterally without stenosis. Superior cerebellar arteries patent bilaterally. Venous sinuses: Normal venous enhancement Anatomic variants: None Review of the MIP images confirms the above findings IMPRESSION: 1. Negative for carotid or vertebral artery stenosis in the neck. 2. No intracranial large vessel occlusion. 3. 12 mm right thyroid nodule. No further imaging necessary. Electronically Signed   By: Franchot Gallo M.D.   On: 07/09/2022 17:56   CT Head Wo Contrast  Result Date: 07/09/2022 CLINICAL DATA:  Provided history: Dizziness, persistent/recurrent, cardiac or vascular cause suspected. EXAM: CT HEAD WITHOUT CONTRAST TECHNIQUE: Contiguous axial images were obtained from the base of the skull through the vertex without intravenous contrast. RADIATION DOSE REDUCTION: This exam was performed according to the departmental dose-optimization program which includes automated exposure control,  adjustment of the mA and/or kV according to patient size and/or use of iterative reconstruction technique. COMPARISON:  No pertinent prior exams available for  comparison. FINDINGS: Brain: No age advanced or lobar predominant parenchymal atrophy. 12 mm focus of hypodensity within the right cerebellar hemisphere (series 2, image 8) (series 4, image 53). 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 There is no acute intracranial hemorrhage. No demarcated cortical infarct. No extra-axial fluid collection. No evidence of an intracranial mass. No midline shift. Vascular: No hyperdense vessel. Skull: No fracture or aggressive osseous lesion. Sinuses/Orbits: No orbital mass or acute orbital finding. Postsurgical appearance of the paranasal sinuses. Mild mucosal thickening, and small mucous retention cysts, within the left maxillary sinus. Trace mucosal thickening scattered within the bilateral ethmoidectomy cavities. Moderate mucosal thickening within the right frontoethmoidal recess. Mild mucosal thickening elsewhere within the right frontal sinus. IMPRESSION: 12 mm focus of abnormal hypodensity within the right cerebellar hemisphere. Although nonspecific, this likely reflects an age-indeterminate infarct, and a brain MRI should be considered for further evaluation. 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 Paranasal sinus disease, as described. Electronically Signed   By: Kellie Simmering D.O.   On: 07/09/2022 15:51   DG Chest 2 View  Result Date: 07/09/2022 CLINICAL DATA:  Chest pain EXAM: CHEST - 2 VIEW COMPARISON:  12/03/2021 FINDINGS: The heart size and mediastinal contours are within normal limits. Both lungs are clear. The visualized skeletal structures are unremarkable. IMPRESSION: No active cardiopulmonary disease. Electronically Signed   By: Ulyses Jarred M.D.   On: 07/09/2022 13:17      Subjective: Seen and examined on the day of discharge.  Stable no distress.  Appropriate for discharge home  Discharge Exam: Vitals:   07/10/22 0723 07/10/22 1119  BP: 104/66  101/74  Pulse: 76 64  Resp: 18 17  Temp: 97.7 F (36.5 C) 97.8 F (36.6 C)  SpO2: 96% 96%   Vitals:   07/10/22 0508 07/10/22 0610 07/10/22 0723 07/10/22 1119  BP: (!) 88/56 90/67 104/66 101/74  Pulse: 67 75 76 64  Resp: 16  18 17   Temp: 97.7 F (36.5 C)  97.7 F (36.5 C) 97.8 F (36.6 C)  TempSrc: Oral   Oral  SpO2: 96%  96% 96%  Weight:      Height:        General: Pt is alert, awake, not in acute distress Cardiovascular: RRR, S1/S2 +, no rubs, no gallops Respiratory: CTA bilaterally, no wheezing, no rhonchi Abdominal: Soft, NT, ND, bowel sounds + Extremities: no edema, no cyanosis    The results of significant diagnostics from this hospitalization (including imaging, microbiology, ancillary and laboratory) are listed below for reference.     Microbiology: No results found for this or any previous visit (from the past 240 hour(s)).   Labs: BNP (last 3 results) Recent Labs    12/03/21 0837 07/09/22 1254  BNP 19.1 [id number]   Basic Metabolic Panel: Recent Labs  Lab 07/09/22 1254  NA 135  K 3.6  CL 100  CO2 26  GLUCOSE 184*  BUN 18  CREATININE 1.11  CALCIUM 8.8*   Liver Function Tests: No results for input(s): "AST", "ALT", "ALKPHOS", "BILITOT", "PROT", "ALBUMIN" in the last 168 hours. No results for input(s): "LIPASE", "AMYLASE" in the last 168 hours. No results for input(s): "AMMONIA" in the last 168 hours. CBC: Recent Labs  Lab 07/09/22 1254  WBC 9.1  HGB 14.4  HCT 43.0  MCV 97.9  PLT 222   Cardiac Enzymes: No results for input(s): "CKTOTAL", "CKMB", "CKMBINDEX", "TROPONINI" in the last 168 hours. BNP: Invalid input(s): "POCBNP" CBG: Recent Labs  Lab 07/09/22 1304 07/09/22 2200 07/10/22 0034 07/10/22 0747  GLUCAP 194* 131* 146* 126*   D-Dimer No results for input(s): "DDIMER" in the last 72 hours. Hgb A1c Recent Labs    07/10/22 0605  HGBA1C 7.2*   Lipid Profile Recent Labs    07/10/22 0605  CHOL 166  HDL 33*  LDLCALC 104*   TRIG 145  CHOLHDL 5.0   Thyroid function studies No results for input(s): "TSH", "T4TOTAL", "T3FREE", "THYROIDAB" in the last 72 hours.  Invalid input(s): "FREET3" Anemia work up No results for input(s): "VITAMINB12", "FOLATE", "FERRITIN", "TIBC", "IRON", "RETICCTPCT" in the last 72 hours. Urinalysis No results found for: "COLORURINE", "APPEARANCEUR", "LABSPEC", "PHURINE", "GLUCOSEU", "HGBUR", "BILIRUBINUR", "KETONESUR", "PROTEINUR", "UROBILINOGEN", "NITRITE", "LEUKOCYTESUR" Sepsis Labs Recent Labs  Lab 07/09/22 1254  WBC 9.1   Microbiology No results found for this or any previous visit (from the past 240 hour(s)).   Time coordinating discharge: Over 30 minutes  SIGNED:   Sidney Ace, MD  [facility] 07/10/2022, 1:48 PM Pager   If 7PM-7AM, please contact night-coverage

## 2022-07-10 NOTE — Progress Notes (Signed)
Chaplain responded to nurse consult. Chaplain provided compassionate presence and reflective listening as patient spoke about health challenges. Chaplain provided prayer and pastoral care as needed. Chaplain services are available for follow up as necessary.

## 2022-07-10 NOTE — Evaluation (Signed)
Physical Therapy Evaluation Patient Details Name: Steven Brandt MRN: [id number] DOB: Mar 06, 1956 Today's Date: 07/10/2022  History of Present Illness  66 y.o. 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   Clinical Impression  Patient reports that he lives with his spouse in a 1 level house, patient is independent at baseline without assistive device use and works full time. Today, patient was independent with bed mobility and transfers, he ambulated 200 feet with supervision, without assistive device use. Of note, slightly decreased gait speed and mild unsteadiness during dual gait tasks. Patient also with increased postural sway with full tandem stance, patient reports he has been experiencing mild balance issues for past 8-9 months. Patient reports recent "short term memory issues", patient able to complete 1 minute and 10 minute recall, successfully recalling 3/3 objects.  Discussed recommendation for outpatient PT for balance work to reduce falls risk.  From a PT perspective, patient is cleared to D/C home with outpatient services at this time. Will continue to follow at a low frequency while in house to address higher level balance impairments.     Recommendations for follow up therapy are one component of a multi-disciplinary discharge planning process, led by the attending physician.  Recommendations may be updated based on patient status, additional functional criteria and insurance authorization.  Follow Up Recommendations Outpatient PT      Assistance Recommended at Discharge PRN  Patient can return home with the following  Other (comment) (n/a)    Equipment Recommendations None recommended by PT   Recommendations for Other Services       Functional Status Assessment Patient has had a recent decline in their functional status and demonstrates the ability to make significant improvements in function in a reasonable and predictable amount of time.     Precautions / Restrictions Precautions Precautions: Fall Precaution Comments: minimal falls risk Restrictions Weight Bearing Restrictions: No      Mobility  Bed Mobility Overal bed mobility: Independent                  Transfers Overall transfer level: Independent Equipment used: None                    Ambulation/Gait Ambulation/Gait assistance: Supervision Gait Distance (Feet): 150 Feet Assistive device: None Gait Pattern/deviations: Wide base of support Gait velocity:  (slowed gait speed during dual tasking)        Stairs            Wheelchair Mobility    Modified Rankin (Stroke Patients Only)       Balance                                 Standardized Balance Assessment Standardized Balance Assessment :  (sharpened romberg: postural sway and mild LOB with tandem stance)           Pertinent Vitals/Pain Pain Assessment Pain Assessment: No/denies pain    Home Living Family/patient expects to be discharged to:: Private residence Living Arrangements: Spouse/significant other Available Help at Discharge: Family;Available 24 hours/day Type of Home: House Home Access: Stairs to enter Entrance Stairs-Rails: None Entrance Stairs-Number of Steps: 1   Home  Layout: One level Home Equipment: None      Prior Function Prior Level of Function : Independent/Modified Independent;Working/employed;Driving             Mobility Comments:  (works full time as a Clinical biochemist) ADLs Comments: Indep with ADL and IADL, working full time as a Clinical biochemist with [employer]     Hand Dominance        Extremity/Trunk Assessment   Upper Extremity Assessment Upper Extremity  Assessment: Overall WFL for tasks assessed (no sensory, coordination, or strength deficits appreciated)    Lower Extremity Assessment Lower Extremity Assessment: Overall WFL for tasks assessed (strength, coordination, sensation WFL)    Cervical / Trunk Assessment Cervical / Trunk Assessment: Normal  Communication   Communication: HOH (wears aids)  Cognition Arousal/Alertness: Awake/alert                                     General Comments:  (patient reports mild short term memory deficits, present since 05/2022)        General Comments    Exercises     Assessment/Plan    PT Assessment   PT Problem List Decreased cognition;Decreased balance       PT Treatment Interventions Gait training;Functional mobility training;Balance training    PT Goals (Current goals can be found in the Care Plan section)  Acute Rehab PT Goals Patient Stated Goal: return home/to work PT Goal Formulation: With patient Time For Goal Achievement: 07/24/22 Potential to Achieve Goals: Good    Frequency Min 2X/week     Co-evaluation               AM-PAC PT "6 Clicks" Mobility  Outcome Measure Help needed turning from your back to your side while in a flat bed without using bedrails?: None Help needed moving from lying on your back to sitting on the side of a flat bed without using bedrails?: None Help needed moving to and from a bed to a chair (including a wheelchair)?: None Help needed standing up from a chair using your arms (e.g., wheelchair or bedside chair)?: None Help needed to walk in hospital room?: None Help needed climbing 3-5 steps with a railing? : None 6 Click Score: 24    End of Session     Patient left: in bed;with call bell/phone within reach Nurse Communication: Mobility status PT Visit Diagnosis: Other abnormalities of gait and mobility (R26.89)    Time: 7619-5093 PT Time Calculation (min) (ACUTE ONLY): 26 min   Charges:   PT Evaluation $PT  Eval Low Complexity: 1 Low PT Treatments $Therapeutic Activity: 8-22 mins        Clemetine Marker, PT, DPT, CCS, GCS  Derrell Lolling 07/10/2022, 12:14 PM

## 2022-07-10 NOTE — Progress Notes (Signed)
Melvenia Beam to be D/C'd Home per MD order.  Discussed prescriptions and follow up appointments with the patient. Prescription electronically submitted medication list explained in detail. Pt verbalized understanding. Work note given to patient.   Allergies as of 07/10/2022       Reactions   Biotene Dry Mouth [antiseptic Mouth Rinse] Anaphylaxis   Celecoxib    Gabapentin    Other reaction(s): Drowsy   Molds & Smuts    Other reaction(s): Allergic rhinitis   Pregabalin    Other reaction(s): Depressive disorder        Medication List     STOP taking these medications    Entresto 24-26 MG Generic drug: sacubitril-valsartan   Entresto 97-103 MG Generic drug: sacubitril-valsartan       TAKE these medications    aspirin EC 81 MG tablet Take 1 tablet (81 mg total) by mouth daily. Swallow whole.   buPROPion HCl ER (XL) 450 MG Tb24 Take 450 mg by mouth daily.   carvedilol 12.5 MG tablet Commonly known as: COREG Take 12.5 mg by mouth 2 (two) times daily with a meal. What changed: Another medication with the same name was removed. Continue taking this medication, and follow the directions you see here.   cholecalciferol 25 MCG (1000 UNIT) tablet Commonly known as: VITAMIN D3 Take 1,000 Units by mouth daily.   cyanocobalamin 500 MCG tablet Commonly known as: VITAMIN B12 Take 500 mcg by mouth daily.   furosemide 20 MG tablet Commonly known as: LASIX Take 20 mg by mouth every other day.   mirtazapine 15 MG tablet Commonly known as: REMERON Take 15 mg by mouth at bedtime.   nitroGLYCERIN 0.4 MG SL tablet Commonly known as: NITROSTAT Place 1 tablet (0.4 mg total) under the tongue every 5 (five) minutes as needed for chest pain.   pantoprazole 40 MG tablet Commonly known as: PROTONIX Take 1 tablet (40 mg total) by mouth 2 (two) times daily.   rosuvastatin 20 MG tablet Commonly known as: CRESTOR Take 1 tablet (20 mg total) by mouth daily.   spironolactone 25 MG  tablet Commonly known as: ALDACTONE Take 0.5 tablets (12.5 mg total) by mouth daily. What changed: how much to take   vitamin C 1000 MG tablet Take 1,000 mg by mouth daily.        Vitals:   07/10/22 0723 07/10/22 1119  BP: 104/66 101/74  Pulse: 76 64  Resp: 18 17  Temp: 97.7 F (36.5 C) 97.8 F (36.6 C)  SpO2: 96% 96%    Skin clean, dry and intact without evidence of skin break down, no evidence of skin tears noted. IV catheter discontinued intact. Site without signs and symptoms of complications. Dressing and pressure applied. Pt denies pain at this time. No complaints noted.  An After Visit Summary was printed and given to the patient. Patient waiting for ride to be discharged home  [place]

## 2022-07-10 NOTE — Progress Notes (Signed)
SLP Cancellation Note  Patient Details Name: Steven Brandt MRN: [id number] DOB: 1955/11/06   Cancelled treatment:       Reason Eval/Treat Not Completed: SLP screened, no needs identified, will sign off Chart review and and cognitive screen completed. Pt presents with intact verbal/expressive language, attention, insight, safety awareness, and problem solving. Pt endorsed current being at cognitive baseline and reported intermittent lapses in STM starting roughly 2 months ago. SLP provided education for potential benefit for OP SLP assessment. Pt deferred at this time. No further acute SLP services indicated at this time. Please re-consult in the event of acute need. Thank you   Steven Shireen Rayburn Clapp MS [facility] SLP   Steven Brandt 07/10/2022, 11:39 AM

## 2022-07-10 NOTE — Progress Notes (Addendum)
Pt is complaining of burning in pt penis and can not pee. Pt BP 92/67 MAP 74 HR 75. Pt went for CT with contrast and MRI WO contrast yesterday. . Per pt " I felt like the contrast made me feel this way." NP Foust made aware  Will notify incoming shift. Will continue to monitor.  Update 0636: NP Foust placed order. Incoming shift made aware Will continue to monitor.

## 2022-07-10 NOTE — Progress Notes (Signed)
   07/10/22 1500  Clinical Encounter Type  Visited With Patient  Visit Type Initial  Referral From Nurse  Consult/Referral To Chaplain   Chaplain followed up code stroke. Chaplain provided compassionate presence and reflective listening as patient spoke about events that brought him here. Chaplain services are available for follow up as needed.

## 2022-07-10 NOTE — TOC Transition Note (Signed)
Transition of Care [facility]) - CM/SW Discharge Note   Patient Details  Name: Karion Cudd MRN: [id number] Date of Birth: Jul 09, 1956  [facility] CM/SW Contact:  Tiburcio Bash, LCSW Phone Number: 07/10/2022, 3:10 PM   Clinical Narrative:     Met with patient at bedside, patient to dc home today. Patient is agreeable to outpatient PT at [facility]. Reports no dme needs. States he has a PCP but is in [facility] where he plans on moving back to.  Reports he is with [facility], CSW has sent outpatient PT referral to [facility] To follow up with patient and his insurance to schedule outpatient PT.   Patient reports he has someone picking him up today at dc.    Final next level of care: Home/Self Care Barriers to Discharge: No Barriers Identified   Patient Goals and CMS Choice Patient states their goals for this hospitalization and ongoing recovery are:: to go home CMS Medicare.gov Compare Post Acute Care list provided to:: Patient Choice offered to / list presented to : Patient  Discharge Placement                       Discharge Plan and Services                                     Social Determinants of Health (SDOH) Interventions     Readmission Risk Interventions     No data to display

## 2022-07-11 ENCOUNTER — Other Ambulatory Visit: Payer: Self-pay

## 2022-09-17 ENCOUNTER — Other Ambulatory Visit: Payer: Self-pay

## 2022-09-17 ENCOUNTER — Emergency Department (HOSPITAL_COMMUNITY): Payer: No Typology Code available for payment source

## 2022-09-17 ENCOUNTER — Encounter (HOSPITAL_COMMUNITY): Payer: Self-pay | Admitting: Emergency Medicine

## 2022-09-17 ENCOUNTER — Emergency Department (HOSPITAL_COMMUNITY)
Admission: EM | Admit: 2022-09-17 | Discharge: 2022-09-17 | Disposition: A | Discharge status: Home / Self Care | Payer: No Typology Code available for payment source | Attending: Emergency Medicine | Admitting: Emergency Medicine

## 2022-09-17 DIAGNOSIS — I509 Heart failure, unspecified: Secondary | ICD-10-CM | POA: Insufficient documentation

## 2022-09-17 DIAGNOSIS — I110 Hypertensive heart disease with heart failure: Secondary | ICD-10-CM | POA: Diagnosis not present

## 2022-09-17 DIAGNOSIS — K449 Diaphragmatic hernia without obstruction or gangrene: Secondary | ICD-10-CM | POA: Insufficient documentation

## 2022-09-17 DIAGNOSIS — R002 Palpitations: Secondary | ICD-10-CM

## 2022-09-17 DIAGNOSIS — E119 Type 2 diabetes mellitus without complications: Secondary | ICD-10-CM | POA: Insufficient documentation

## 2022-09-17 DIAGNOSIS — R079 Chest pain, unspecified: Secondary | ICD-10-CM

## 2022-09-17 DIAGNOSIS — Z79899 Other long term (current) drug therapy: Secondary | ICD-10-CM | POA: Diagnosis not present

## 2022-09-17 DIAGNOSIS — Z7982 Long term (current) use of aspirin: Secondary | ICD-10-CM | POA: Insufficient documentation

## 2022-09-17 DIAGNOSIS — I493 Ventricular premature depolarization: Secondary | ICD-10-CM

## 2022-09-17 DIAGNOSIS — R0789 Other chest pain: Secondary | ICD-10-CM | POA: Diagnosis present

## 2022-09-17 HISTORY — DX: Heart failure, unspecified: I50.9

## 2022-09-17 LAB — BASIC METABOLIC PANEL
Anion gap: 9 (ref 5–15)
BUN: 14 mg/dL (ref 8–23)
CO2: 26 mmol/L (ref 22–32)
Calcium: 9.2 mg/dL (ref 8.9–10.3)
Chloride: 101 mmol/L (ref 98–111)
Creatinine, Ser: 1.02 mg/dL (ref 0.61–1.24)
GFR, Estimated: >60 mL/min (ref >60)
Glucose, Bld: 199 mg/dL — ABNORMAL HIGH (ref 70–99)
Potassium: 3.7 mmol/L (ref 3.5–5.1)
Sodium: 136 mmol/L (ref 135–145)

## 2022-09-17 LAB — CBC
HCT: 44 % (ref 39.0–52.0)
Hemoglobin: 15.4 g/dL (ref 13.0–17.0)
MCH: 33.9 pg (ref 26.0–34.0)
MCHC: 35 g/dL (ref 30.0–36.0)
MCV: 96.9 fL (ref 80.0–100.0)
Platelets: 184 10*3/uL (ref 150–400)
RBC: 4.54 MIL/uL (ref 4.22–5.81)
RDW: 11.5 % (ref 11.5–15.5)
WBC: 8.4 10*3/uL (ref 4.0–10.5)
nRBC: 0 % (ref 0.0–0.2)

## 2022-09-17 LAB — TROPONIN I (HIGH SENSITIVITY)
Troponin I (High Sensitivity): 8 ng/L (ref <18)
Troponin I (High Sensitivity): 8 ng/L (ref <18)

## 2022-09-17 LAB — MAGNESIUM: Magnesium: 1.7 mg/dL (ref 1.7–2.4)

## 2022-09-17 MED ORDER — IOHEXOL 350 MG/ML SOLN
100.0000 mL | Freq: Once | INTRAVENOUS | Status: AC | PRN
  Administered 2022-09-17: 100 mL via INTRAVENOUS

## 2022-09-17 NOTE — ED Triage Notes (Signed)
Pt. Stated, Ive had chest pain, jaw pain arm pain , and some palpitations this started about 2 days ago. Im having heart failure .

## 2022-09-17 NOTE — ED Provider Triage Note (Signed)
Emergency Medicine Provider Triage Evaluation Note  Steven Brandt , a 67 y.o. male  was evaluated in triage.  Pt complains of chest pain describing it as a squeezing feeling to the substernal area radiating to his jaw, his left arm.  Prior history of heart failure with an EF of 10%, received care in [place] however has not seek cardiology consultation while in [place].  He was evaluated [place] previously, reports they were unable to find anything, he was supposed to have a pacemaker due to irregular rhythm however they decided to not do this as his rhythm changed while he was on the table.  Endorsing fatigue, dizziness, feeling overall unwell, felt that his palpitations were severe last night his chest pain was severe in nature.  Did not take any medication for improvement in symptoms. Recent ECHO in 07/2022 = 50-55%  Review of Systems  Positive: Chest pain, palpitations, sob Negative: Fever, leg swelling  Physical Exam  BP (!) 126/95 (BP Location: Right Arm)   Pulse 64   Temp 97.8 F (36.6 C) (Oral)   Resp 16   SpO2 98%  Gen:   Awake, no distress   Resp:  Normal effort  MSK:   Moves extremities without difficulty  Other:    Medical Decision Making  Medically screening exam initiated at 10:49 AM.  Appropriate orders placed.  Kass Herberger was informed that the remainder of the evaluation will be completed by another provider, this initial triage assessment does not replace that evaluation, and the importance of remaining in the ED until their evaluation is complete.     Janeece Fitting, PA-C 09/17/22 1100

## 2022-09-17 NOTE — ED Notes (Signed)
Patient transported to CT 

## 2022-09-17 NOTE — ED Notes (Signed)
IV in place during time of assessment, but never charted by previous nurse. Unknown when or by who placed by. CT called letting them know IV is charted

## 2022-09-17 NOTE — Progress Notes (Signed)
Follow up visit checking in on a fellow chaplain. Shared in a brief conversation, will follow up for support.

## 2022-09-17 NOTE — ED Provider Notes (Signed)
[facility] EMERGENCY DEPARTMENT Provider Note   CSN: [id number] Arrival date & time: 09/17/22  1020     History {Add pertinent medical, surgical, social history, OB history to HPI:1} Chief Complaint  Patient presents with   Chest Pain   Shortness of Breath   Palpitations    Steven Brandt is a 67 y.o. male.  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  Described as a squeezing sensation substernally with radiation to his jaw and left arm   Had cardiac catheterization in March where he was found to have normal coronaries and normal left ventricular ejection fraction of 50 to 55% by visual estimate  No recent surgeries, long trips car or airplane, no hx of blood clots  No smoking, etoh, other drugs Does have family hx of heart disease, brother with pacemaker  Past Medical History:  Diagnosis Date   Heart failure ([place])      Home Medications Prior to Admission medications   Medication Sig Start Date End Date Taking? Authorizing Provider  Ascorbic Acid (VITAMIN C) 1000 MG tablet Take 1,000 mg by mouth daily.    [provider]  aspirin EC 81 MG tablet Take 1 tablet (81 mg total) by mouth daily. Swallow whole. Patient not taking: Reported on 07/09/2022 12/06/21   Lorella Nimrod, MD  buPROPion HCl ER, XL, 450 MG TB24 Take 450 mg by mouth daily.    [provider]  carvedilol (COREG) 12.5 MG tablet Take 12.5 mg by mouth 2 (two) times daily with a meal.    [provider]  cholecalciferol (VITAMIN D3) 25 MCG (1000 UNIT) tablet Take 1,000 Units by mouth  daily.    [provider]  furosemide (LASIX) 20 MG tablet Take 20 mg by mouth every other day.    [provider]  mirtazapine (REMERON) 15 MG tablet Take 15 mg by mouth at bedtime.    [provider]  nitroGLYCERIN (NITROSTAT) 0.4 MG SL tablet Place 1 tablet (0.4 mg total) under the tongue every 5 (five) minutes as needed for chest pain. 12/05/21   Lorella Nimrod, MD  pantoprazole (PROTONIX) 40 MG tablet Take 1 tablet (40 mg total) by mouth 2 (two) times daily. 12/05/21   Lorella Nimrod, MD  rosuvastatin (CRESTOR) 20 MG tablet Take 1 tablet (20 mg total) by mouth daily. Patient not taking: Reported on 07/09/2022 12/05/21   Lorella Nimrod, MD  spironolactone (ALDACTONE) 25 MG tablet Take 0.5 tablets (12.5 mg total) by mouth daily. 07/10/22 08/10/22  Sidney Ace, MD  vitamin B-12 (CYANOCOBALAMIN) 500 MCG tablet Take 500 mcg by mouth daily.    [provider]      Allergies    Biotene dry mouth [antiseptic mouth rinse], Celecoxib, Gabapentin, Molds & smuts, and Pregabalin    Review of Systems   Review of Systems  Physical Exam Updated Vital Signs BP (!) 130/90   Pulse 76   Temp 98.4 F (36.9 C)   Resp 12   Ht 5\' 7"  (1.702 m)   Wt 81.6 kg   SpO2 99%   BMI 28.19 kg/m  Physical Exam Vitals  and nursing note reviewed.  Constitutional:      General: He is not in acute distress.    Appearance: He is well-developed. He is not diaphoretic.  HENT:     Head: Normocephalic and atraumatic.  Eyes:     Conjunctiva/sclera: Conjunctivae normal.  Cardiovascular:     Rate and Rhythm: Normal rate and regular rhythm.     Heart sounds: Normal heart sounds. No murmur heard.    No friction rub. No gallop.  Pulmonary:     Effort: Pulmonary effort is normal. No respiratory distress.     Breath sounds: Normal breath sounds. No wheezing or rales.  Abdominal:     General: There is no distension.     Palpations: Abdomen is soft.     Tenderness: There is no  abdominal tenderness. There is no guarding.  Musculoskeletal:     Cervical back: Normal range of motion.  Skin:    General: Skin is warm and dry.  Neurological:     Mental Status: He is alert and oriented to person, place, and time.     ED Results / Procedures / Treatments   Labs (all labs ordered are listed, but only abnormal results are displayed) Labs Reviewed  BASIC METABOLIC PANEL - Abnormal; Notable for the following components:      Result Value   Glucose, Bld 199 (*)    All other components within normal limits  CBC  MAGNESIUM  TROPONIN I (HIGH SENSITIVITY)  TROPONIN I (HIGH SENSITIVITY)    EKG EKG Interpretation  Date/Time:  Wednesday September 17 2022 10:44:34 EST Ventricular Rate:  63 PR Interval:  150 QRS Duration: 108 QT Interval:  422 QTC Calculation: 431 R Axis:   -55 Text Interpretation: Normal sinus rhythm Left anterior fascicular block Moderate voltage criteria for LVH, may be normal variant ( R in aVL , Cornell product ) Possible Anterior infarct , age undetermined Abnormal ECG When compared with ECG of 09-Jul-2022 12:54, PREVIOUS ECG IS PRESENT Confirmed by Lorre Nick ([id number]) on 09/17/2022 1:51:13 PM  Radiology DG Chest Port 1 View  Result Date: 09/17/2022 CLINICAL DATA:  Chest pain EXAM: PORTABLE CHEST 1 VIEW COMPARISON:  07/09/22 CXR FINDINGS: No pleural effusion. No pneumothorax. No focal airspace opacity. Normal cardiac and mediastinal contours. Cervical and thoracic spinal fusion hardware in place. No displaced rib fractures. Visualized upper abdomen is unremarkable. IMPRESSION: No focal airspace opacity. Electronically Signed   By: Lorenza Cambridge M.D.   On: 09/17/2022 11:28    Procedures Procedures  {Document cardiac monitor, telemetry assessment procedure when appropriate:1}  Medications Ordered in ED Medications - No data to display  ED Course/ Medical Decision Making/ A&P                           Medical Decision Making   67 year old  male with a history of hypertension, hyperlipidemia, diabetes, cardiomyopathy (EF 10 to 15% in 2018, 60 to 65% by echo in March 2023) depression, ILD, PTSD, status post Roux-en-Y gastric bypass, GERD, status post cervical and thoracic spinal fusion, alcoholism in remission, recent admission in November for dizziness, 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  Differential diagnosis for chest pain includes pulmonary embolus, dissection, pneumothorax, pneumonia, ACS, myocarditis, pericarditis.      EKG completed and personally advised interpreted by me shows no acute change in comparison to prior.  Chest x-ray completed and personally about interpreted by  me shows no evidence of pneumonia, pneumothorax, or pulmonary edema.    Labs completed and personally about interpreted by me . delta troponins negative, have low suspicion for ACS.  CB without significant anemia, no leukocytosis, BMP without clinically significant electrolyte abnormalities or kidney injury.   {Document critical care time when appropriate:1} {Document review of labs and clinical decision tools ie heart score, Chads2Vasc2 etc:1}  {Document your independent review of radiology images, and any outside records:1} {Document your discussion with family members, caretakers, and with consultants:1} {Document social determinants of health affecting pt's care:1} {Document your decision making why or why not admission, treatments were needed:1} Final Clinical Impression(s) / ED Diagnoses Final diagnoses:  None    Rx / DC Orders ED Discharge Orders     None

## 2022-10-05 ENCOUNTER — Other Ambulatory Visit (HOSPITAL_COMMUNITY): Payer: Self-pay

## 2022-10-05 ENCOUNTER — Other Ambulatory Visit: Payer: Self-pay

## 2022-10-07 ENCOUNTER — Encounter (HOSPITAL_COMMUNITY): Payer: Self-pay

## 2022-10-07 ENCOUNTER — Other Ambulatory Visit (HOSPITAL_COMMUNITY): Payer: Self-pay

## 2022-10-07 NOTE — Progress Notes (Signed)
Cardiology Office Note:    Date:  10/13/2022   ID:  Steven Brandt, DOB February 17, 1956, MRN [id number]  PCP:  Patient, No Pcp Per   [facility] Providers Cardiologist:  None     Referring MD: Gareth Morgan, MD   Chief Complaint  Patient presents with   Congestive Heart Failure    History of Present Illness:    Steven Brandt is a 67 y.o. male is seen to establish cardiac care.  He has previously been followed by Dr Humphrey Rolls in [place] and before that the [facility] hospital in [place]. He has a history of HTN, HLD, DM, nonischemic cardiomyopathy (EF 10~15% in 2018, and EF 60-65% by 2d echo on 12/03/21 ), depression, ILD, PTSD, ADH, s/p of Roux-en-Y gastric bypass, GERD, s/p of cervical and thoracic spin fusion. He is a recovered alcoholic from [place]27. His acute CHF in 2018 was post viral from coronavirus. He was treated with recovery of LV dysfunction. He has been on chronic therapy with Coreg, Entresto, aldactone and lasix.  He did undergo cardiac cath in March 20233 showing normal coronary arteries. Echo at that time showed normal LV function and again in November. He was diagnosed with prior stroke found incidentally on MRI. 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 Denies any chest pain, SOB, palpitations, dizziness or edema. Feels well. Tolerating medication well.   Past Medical History:  Diagnosis Date   CHF (congestive heart failure) (HCC)    Diabetes mellitus without complication ([place])    Heart failure (HCC)    Hypertension    Pulmonary fibrosis ([place])    Stroke ([place])    Viral pneumonia     Past Surgical History:  Procedure Laterality Date   arthroscopic knee surgery Bilateral    CERVICAL FUSION     HERNIA REPAIR Bilateral    LEFT HEART CATH AND CORONARY ANGIOGRAPHY N/A 12/05/2021   Procedure: LEFT HEART CATH AND CORONARY ANGIOGRAPHY;  Surgeon: Dionisio David, MD;  Location: [facility];  Service: Cardiovascular;  Laterality: N/A;   right knee  reconstruction     ROTATOR CUFF REPAIR Left    ROUX-EN-Y GASTRIC BYPASS     THORACIC FUSION     UMBILICAL HERNIA REPAIR      Current Medications: Current Meds  Medication Sig   Ascorbic Acid (VITAMIN C) 1000 MG tablet Take 1,000 mg by mouth daily.   aspirin EC 81 MG tablet Take 1 tablet (81 mg total) by mouth daily. Swallow whole.   buPROPion HCl ER, XL, 450 MG TB24 Take 450 mg by mouth daily.   carvedilol (COREG) 12.5 MG tablet Take 12.5 mg by mouth 2 (two) times daily with a meal.   cholecalciferol (VITAMIN D3) 25 MCG (1000 UNIT) tablet Take 1,000 Units by mouth daily.   furosemide (LASIX) 20 MG tablet Take 20 mg by mouth every other day.   mirtazapine (REMERON) 15 MG tablet Take 15 mg by mouth at bedtime.   nitroGLYCERIN (NITROSTAT) 0.4 MG SL tablet Place 1 tablet (0.4 mg total) under the tongue every 5 (five) minutes as needed for chest pain.   pantoprazole (PROTONIX) 40 MG tablet Take 1 tablet (40 mg total) by mouth 2 (two) times daily.   rosuvastatin (CRESTOR) 20 MG tablet Take 1 tablet (20 mg total) by mouth daily.   sacubitril-valsartan (ENTRESTO) 97-103 MG TAKE 1 TABLET BY MOUTH TWICE A DAY FOR HEART   spironolactone (ALDACTONE) 25 MG tablet Take 0.5 tablets (12.5 mg total) by mouth daily.  vitamin B-12 (CYANOCOBALAMIN) 500 MCG tablet Take 500 mcg by mouth daily.     Allergies:   Biotene dry mouth [antiseptic mouth rinse], Celecoxib, Gabapentin, Molds & smuts, and Pregabalin   Social History   Socioeconomic History   Marital status: Married    Spouse name: Not on file   Number of children: 2   Years of education: Not on file   Highest education level: Not on file  Occupational History   Not on file  Tobacco Use   Smoking status: Never   Smokeless tobacco: Never  Substance and Sexual Activity   Alcohol use: Not Currently    Comment: quit in 2012   Drug use: Never   Sexual activity: Not on file  Other Topics Concern   Not on file  Social History Narrative    Retired from the [employer]   Social Determinants of Radio broadcast assistant Strain: Not on file  Food Insecurity: Not on file  Transportation Needs: Not on file  Physical Activity: Not on file  Stress: Not on file  Social Connections: Not on file     Family History: The patient's family history includes Heart attack (age of onset: 55) in his father; Heart disease in his father; Heart failure in his brother; Heart failure (age of onset: 83) in his mother; Hypertension in his mother.  ROS:   Please see the history of present illness.     All other systems reviewed and are negative.  EKGs/Labs/Other Studies Reviewed:    The following studies were reviewed today: Cardiac cath 12/05/21:  LEFT HEART CATH AND CORONARY ANGIOGRAPHY   Conclusion      The left ventricular systolic function is normal.   The left ventricular ejection fraction is 50-55% by visual estimate.   Normal coronaries and LVEF. Advise aggressive GERD treatment.    Echo 07/09/22: IMPRESSIONS     1. Left ventricular ejection fraction, by estimation, is 50 to 55%. The  left ventricle has low normal function. The left ventricle has no regional  wall motion abnormalities. Left ventricular diastolic parameters are  consistent with Grade I diastolic  dysfunction (impaired relaxation).   2. Right ventricular systolic function is normal. The right ventricular  size is normal.   3. The mitral valve is normal in structure. No evidence of mitral valve  regurgitation.   4. The aortic valve is normal in structure. Aortic valve regurgitation is  not visualized.   EXAM: CT ANGIOGRAPHY CHEST, ABDOMEN AND PELVIS   TECHNIQUE: Non-contrast CT of the chest was initially obtained.   Multidetector CT imaging through the chest, abdomen and pelvis was performed using the standard protocol during bolus administration of intravenous contrast. Multiplanar reconstructed images and MIPs were obtained and reviewed to evaluate  the vascular anatomy.   RADIATION DOSE REDUCTION: This exam was performed according to the departmental dose-optimization program which includes automated exposure control, adjustment of the mA and/or kV according to patient size and/or use of iterative reconstruction technique.   CONTRAST:  180mL OMNIPAQUE IOHEXOL 350 MG/ML SOLN   COMPARISON:  12/03/2021   FINDINGS: CTA CHEST FINDINGS   Cardiovascular: No focal abnormalities are seen thoracic aorta in the noncontrast images. There is homogeneous enhancement in thoracic aorta. There is no demonstrable intimal flap. There are no intraluminal filling defects in pulmonary artery branches.   Mediastinum/Nodes: No significant lymphadenopathy seen in mediastinum. There is inhomogeneous attenuation in thyroid with low-density nodules measuring up to 11 x 7 mm. No follow-up imaging is recommended.  Lungs/Pleura: There is small area of scarring in the anterolateral aspect of right apex with no significant change. There is no focal pulmonary consolidation. There is no pleural effusion or pneumothorax.   Musculoskeletal: No acute findings are seen.   Review of the MIP images confirms the above findings.   CTA ABDOMEN AND PELVIS FINDINGS   VASCULAR   Aorta: There are scattered calcifications and plaques. There is no evidence of dissection or focal aneurysmal dilation.   Celiac: No significant narrowing.   SMA: No significant narrowing.   Renals: Patent without focal narrowing   IMA: Patent   Inflow: Unremarkable.   Veins: Unremarkable.   Review of the MIP images confirms the above findings.   NON-VASCULAR   Hepatobiliary: No focal abnormalities are seen in liver. There is no dilation of bile ducts. Gallbladder is unremarkable.   Pancreas: There is fatty infiltration. No focal abnormalities are seen.   Spleen: Unremarkable.   Adrenals/Urinary Tract: Adrenals are unremarkable. There is no hydronephrosis. There is  focal cortical thinning in the upper poles of both kidneys suggesting scarring. There are no renal or ureteral stones. Urinary bladder is unremarkable.   Stomach/Bowel: Moderate hiatal hernia is seen. Surgical staples are seen at the gastroesophageal junction. There is no significant small bowel dilation. Appendix is not seen. There is no pericecal inflammation. There is wall thickening in the hepatic flexure measuring a proximally 2.9 cm in length. There is no pericolic stranding.   Lymphatic: No significant lymphadenopathy is seen.   Reproductive: There are few calcifications in prostate.   Other: There is no ascites or pneumoperitoneum.   Musculoskeletal: There is surgical fusion in lower thoracic spine.   Review of the MIP images confirms the above findings.   IMPRESSION: There is no evidence of dissection in thoracic and abdominal aorta. Major branches of thoracic and abdominal aorta are patent. There is no focal aneurysmal dilation. There is no evidence of pulmonary embolism.   There is no focal pulmonary consolidation. There is no pleural effusion or pneumothorax.   There is 2.9 cm segment of hepatic flexure shows wall thickening. This may be due to incomplete distention or infiltrative neoplastic process. When the patient's clinical condition permits, endoscopy may be considered.   Aortic atherosclerosis. There is no evidence of intestinal obstruction or pneumoperitoneum. There is no hydronephrosis. There is moderate sized fixed hiatal hernia.   Other findings as described in the body of the report.     Electronically Signed   By: Elmer Picker M.D.   On: 09/17/2022 21:17        EKG:  EKG is  ordered today.  The ekg ordered today demonstrates NSR rate 91. LAFB. LVH. Possible anterior infarct- old. I have personally reviewed and interpreted this study.   Recent Labs: 12/03/2021: ALT 26 07/09/2022: B Natriuretic Peptide 21.0 09/17/2022: BUN 14;  Creatinine, Ser 1.02; Hemoglobin 15.4; Magnesium 1.7; Platelets 184; Potassium 3.7; Sodium 136  Recent Lipid Panel    Component Value Date/Time   CHOL 166 07/10/2022 0605   TRIG 145 07/10/2022 0605   HDL 33 (L) 07/10/2022 0605   CHOLHDL 5.0 07/10/2022 0605   VLDL 29 07/10/2022 0605   LDLCALC 104 (H) 07/10/2022 0605     Risk Assessment/Calculations:                Physical Exam:    VS:  BP 100/72   Pulse 91   Ht 5\' 7"  (1.702 m)   Wt 194 lb 3.2 oz (88.1 kg)  SpO2 95%   BMI 30.42 kg/m     Wt Readings from Last 3 Encounters:  10/13/22 194 lb 3.2 oz (88.1 kg)  09/17/22 180 lb (81.6 kg)  07/10/22 184 lb 15.5 oz (83.9 kg)     GEN:  Well nourished, well developed in no acute distress HEENT: Normal NECK: No JVD; No carotid bruits LYMPHATICS: No lymphadenopathy CARDIAC: RRR, no murmurs, rubs, gallops RESPIRATORY:  Clear to auscultation without rales, wheezing or rhonchi  ABDOMEN: Soft, non-tender, non-distended MUSCULOSKELETAL:  No edema; No deformity  SKIN: Warm and dry NEUROLOGIC:  Alert and oriented x 3 PSYCHIATRIC:  Normal affect   ASSESSMENT:    1. 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 EF initially 10-15%. Now fully recovered. He is asymptomatic. Will continue Entresto, aldactone, Coreg and lasix.  History of CVA noted incidentally on MRI. On ASA ILD. Likely due to viral PNA and/or exposure to toxic smoke during tour of duty in [place] HTN. Controlled. HLD. Started on lipitor in Nov. Will arrange for updated labs DM encouraged low carb plant based diet. Needs to establish with primary care physician. Normal coronary arteries by cath last year.         Follow up in one year.   Medication Adjustments/Labs and Tests Ordered: Current medicines are reviewed at length with the patient today.  Concerns regarding medicines are outlined above.  No orders  of the defined types were placed in this encounter.  No orders of the defined types were placed in this encounter.   There are no Patient Instructions on file for this visit.   Signed, Damarko Stitely Sw[place], MD  10/13/2022 3:36 PM    [facility]

## 2022-10-10 ENCOUNTER — Other Ambulatory Visit: Payer: Self-pay

## 2022-10-13 ENCOUNTER — Other Ambulatory Visit: Payer: Self-pay

## 2022-10-13 ENCOUNTER — Encounter: Payer: Self-pay | Admitting: Cardiology

## 2022-10-13 ENCOUNTER — Ambulatory Visit: Payer: Medicare Other | Attending: Cardiology | Admitting: Cardiology

## 2022-10-13 VITALS — BP 100/72 | HR 91 | Ht 67.0 in | Wt 194.2 lb

## 2022-10-13 DIAGNOSIS — I10 Essential (primary) hypertension: Secondary | ICD-10-CM

## 2022-10-13 DIAGNOSIS — I5022 Chronic systolic (congestive) heart failure: Secondary | ICD-10-CM

## 2022-10-13 DIAGNOSIS — B3324 Viral cardiomyopathy: Secondary | ICD-10-CM

## 2022-10-13 DIAGNOSIS — E7800 Pure hypercholesterolemia, unspecified: Secondary | ICD-10-CM

## 2022-10-13 MED ORDER — FUROSEMIDE 20 MG PO TABS: 20.0000 mg | ORAL_TABLET | ORAL | 3 refills | Status: AC

## 2022-10-13 MED ORDER — SACUBITRIL-VALSARTAN 97-103 MG PO TABS: 1.0000 | ORAL_TABLET | Freq: Two times a day (BID) | ORAL | 3 refills | Status: AC

## 2022-10-13 MED ORDER — ROSUVASTATIN CALCIUM 20 MG PO TABS
20.0000 mg | ORAL_TABLET | Freq: Every day | ORAL | 3 refills | Status: DC
  Filled 2022-10-13: qty 90, 90d supply, fill #0

## 2022-10-13 MED ORDER — VITAMIN C 1000 MG PO TABS: 1000.0000 mg | ORAL_TABLET | Freq: Every day | ORAL | 0 refills | Status: AC

## 2022-10-13 MED ORDER — CYANOCOBALAMIN 500 MCG PO TABS: 500.0000 ug | ORAL_TABLET | Freq: Every day | ORAL | 0 refills | Status: AC

## 2022-10-13 MED ORDER — CARVEDILOL 12.5 MG PO TABS: 12.5000 mg | ORAL_TABLET | Freq: Two times a day (BID) | ORAL | 3 refills | Status: AC

## 2022-10-13 MED ORDER — PANTOPRAZOLE SODIUM 40 MG PO TBEC: 40.0000 mg | DELAYED_RELEASE_TABLET | Freq: Two times a day (BID) | ORAL | 3 refills | Status: AC

## 2022-10-13 MED ORDER — SPIRONOLACTONE 25 MG PO TABS: 25.0000 mg | ORAL_TABLET | Freq: Every day | ORAL | 3 refills | Status: AC

## 2022-10-13 MED ORDER — VITAMIN D 25 MCG (1000 UNIT) PO TABS: 1000.0000 [IU] | ORAL_TABLET | Freq: Every day | ORAL | 0 refills | Status: AC

## 2022-10-13 NOTE — Patient Instructions (Signed)
Medication Instructions:   Your physician recommends that you continue on your current medications as directed. Please refer to the Current Medication list given to you today.  *If you need a refill on your cardiac medications before your next appointment, please call your pharmacy*  Lab Work: Your physician recommends that you return for lab work at your earliest convenience :  CMP Fasting Lipid Panel-DO NOT eat or drink past midnight. Okay to have water the morning of and/or black coffee only  HgA1c  If you have labs (blood work) drawn today and your tests are completely normal, you will receive your results only by: [place] (if you have MyChart) OR A paper copy in the mail If you have any lab test that is abnormal or we need to change your treatment, we will call you to review the results.  Testing/Procedures: NONE ordered at this time of appointment   Follow-Up: At [facility], you and your health needs are our priority.  As part of our continuing mission to provide you with exceptional heart care, we have created designated Provider Care Teams.  These Care Teams include your primary Cardiologist (physician) and Advanced Practice Providers (APPs -  Physician Assistants and Nurse Practitioners) who all work together to provide you with the care you need, when you need it.   Your next appointment:   1 year(s)  Provider:   Peter Martinique, MD     Other Instructions

## 2022-11-08 ENCOUNTER — Encounter: Payer: Self-pay | Admitting: Emergency Medicine

## 2022-11-08 ENCOUNTER — Ambulatory Visit
Admission: EM | Admit: 2022-11-08 | Discharge: 2022-11-08 | Disposition: A | Discharge status: Home / Self Care | Payer: No Typology Code available for payment source | Attending: Urgent Care | Admitting: Urgent Care

## 2022-11-08 DIAGNOSIS — J0100 Acute maxillary sinusitis, unspecified: Secondary | ICD-10-CM | POA: Diagnosis not present

## 2022-11-08 DIAGNOSIS — R062 Wheezing: Secondary | ICD-10-CM

## 2022-11-08 MED ORDER — AZELASTINE HCL 0.1 % NA SOLN: 1.0000 | Freq: Two times a day (BID) | NASAL | 0 refills | Status: AC

## 2022-11-08 MED ORDER — AMOXICILLIN-POT CLAVULANATE 875-125 MG PO TABS: 1.0000 | ORAL_TABLET | Freq: Two times a day (BID) | ORAL | 0 refills | Status: AC

## 2022-11-08 MED ORDER — PREDNISONE 20 MG PO TABS: ORAL_TABLET | ORAL | 0 refills | Status: AC

## 2022-11-08 MED ORDER — ALBUTEROL SULFATE HFA 108 (90 BASE) MCG/ACT IN AERS: 1.0000 | INHALATION_SPRAY | Freq: Four times a day (QID) | RESPIRATORY_TRACT | 0 refills | Status: AC | PRN

## 2022-11-08 NOTE — ED Triage Notes (Signed)
Symptoms starting Sunday, reports large amounts of nasal drainage. Has been using nasal lavage and mucinex without improvement. States it then moved into his chest, and he's been unable to sleep due to the coughing at night. Reports fever at onset of illness following attending a concert last weekend.

## 2022-11-08 NOTE — ED Provider Notes (Signed)
Steven Brandt    CSN: [id number] Arrival date & time: 11/08/22  1212      History   Chief Complaint Chief Complaint  Patient presents with   Nasal Congestion    HPI Steven Brandt is a 67 y.o. male.   HPI  Patient presents with symptoms x 6 days.  He reports nasal drainage.  Has been using nasal lavage and Mucinex without improvement.  He states it "moved into his chest" and unable to sleep because of cough at night.  Reports fever at onset of symptoms, now resolved.  Endorses history of heart failure secondary to respiratory failure in the context of a coronavirus infection and viral pneumonia (pre-COVID).  Chart indicates DM2 however patient endorses prediabetes.  Most recent A1c equals 7.2, well-controlled DM 2.  Past Medical History:  Diagnosis Date   CHF (congestive heart failure) ([place])    Diabetes mellitus without complication ([place])    Heart failure ([place])    Hypertension    Pulmonary fibrosis ([place])    Stroke ([place])    Viral pneumonia     Patient Active Problem List   Diagnosis Date Noted   Stroke-like symptom [id number]   Chronic systolic CHF (congestive heart failure) ([place]) 07/09/2022   HLD (hyperlipidemia) 07/09/2022   Dizziness 12/04/2021   Chest pain 12/03/2021   Post-traumatic stress disorder, unspecified 12/03/2021   Attention deficit hyperactivity disorder 12/03/2021   Cardiomyopathy ([place]) 12/03/2021   Cervical spondylosis without myelopathy 12/03/2021   Chronic back pain 12/03/2021   Controlled type 2 diabetes mellitus without complication, without long-term current use of insulin ([place]) 12/03/2021   Depressive disorder 12/03/2021   Essential hypertension 12/03/2021   Gastroesophageal reflux disease 12/03/2021   Alcoholism ([place]) 12/03/2021   Hearing loss 12/03/2021   History of Roux-en-Y gastric bypass 12/03/2021   History of spinal fusion 12/03/2021   Interstitial lung disease ([place]) 12/03/2021    Past Surgical History:  Procedure  Laterality Date   arthroscopic knee surgery Bilateral    CERVICAL FUSION     HERNIA REPAIR Bilateral    LEFT HEART CATH AND CORONARY ANGIOGRAPHY N/A 12/05/2021   Procedure: LEFT HEART CATH AND CORONARY ANGIOGRAPHY;  Surgeon: Dionisio David, MD;  Location: [facility];  Service: Cardiovascular;  Laterality: N/A;   right knee reconstruction     ROTATOR CUFF REPAIR Left    ROUX-EN-Y GASTRIC BYPASS     THORACIC FUSION     UMBILICAL HERNIA REPAIR         Home Medications    Prior to Admission medications   Medication Sig Start Date End Date Taking? Authorizing Provider  Ascorbic Acid (VITAMIN C) 1000 MG tablet Take 1 tablet (1,000 mg total) by mouth daily. 10/13/22   Martinique, Peter M, MD  aspirin EC 81 MG tablet Take 1 tablet (81 mg total) by mouth daily. Swallow whole. 12/06/21   Lorella Nimrod, MD  buPROPion HCl ER, XL, 450 MG TB24 Take 450 mg by mouth daily.    [provider]  carvedilol (COREG) 12.5 MG tablet Take 1 tablet (12.5 mg total) by mouth 2 (two) times daily with a meal. 10/13/22   Martinique, Peter M, MD  cholecalciferol (VITAMIN D3) 25 MCG (1000 UNIT) tablet Take 1 tablet (1,000 Units total) by mouth daily. 10/13/22   Martinique, Peter M, MD  cyanocobalamin (VITAMIN B12) 500 MCG tablet Take 1 tablet (500 mcg total) by mouth daily. 10/13/22   Martinique, Peter M, MD  furosemide (LASIX) 20 MG tablet Take  1 tablet (20 mg total) by mouth every other day. 10/13/22   Martinique, Peter M, MD  mirtazapine (REMERON) 15 MG tablet Take 15 mg by mouth at bedtime.    [provider]  nitroGLYCERIN (NITROSTAT) 0.4 MG SL tablet Place 1 tablet (0.4 mg total) under the tongue every 5 (five) minutes as needed for chest pain. 12/05/21   Lorella Nimrod, MD  pantoprazole (PROTONIX) 40 MG tablet Take 1 tablet (40 mg total) by mouth 2 (two) times daily. 10/13/22   Martinique, Peter M, MD  rosuvastatin (CRESTOR) 20 MG tablet Take 1 tablet (20 mg total) by mouth daily. 10/13/22   Martinique, Peter M, MD   sacubitril-valsartan (ENTRESTO) 97-103 MG Take 1 tablet by mouth 2 (two) times daily. 10/13/22   Martinique, Peter M, MD  spironolactone (ALDACTONE) 25 MG tablet Take 1 tablet (25 mg total) by mouth daily. 10/13/22   Martinique, Peter M, MD    Family History Family History  Problem Relation Age of Onset   Hypertension Mother    Heart failure Mother 69   Heart attack Father 29   Heart disease Father    Heart failure Brother     Social History Social History   Tobacco Use   Smoking status: Never   Smokeless tobacco: Never  Substance Use Topics   Alcohol use: Not Currently    Comment: quit in 2012   Drug use: Never     Allergies   Biotene dry mouth [antiseptic mouth rinse], Celecoxib, Gabapentin, Molds & smuts, and Pregabalin   Review of Systems Review of Systems   Physical Exam Triage Vital Signs ED Triage Vitals  Enc Vitals Group     BP 11/08/22 1302 129/78     Pulse Rate 11/08/22 1302 62     Resp 11/08/22 1302 18     Temp 11/08/22 1302 97.6 F (36.4 C)     Temp Source 11/08/22 1302 Temporal     SpO2 11/08/22 1302 98 %     Weight --      Height --      Head Circumference --      Peak Flow --      Pain Score 11/08/22 1312 3     Pain Loc --      Pain Edu? --      Excl. in [place]? --    No data found.  Updated Vital Signs BP 129/78 (BP Location: Left Arm)   Pulse 62   Temp 97.6 F (36.4 C) (Temporal)   Resp 18   SpO2 98%   Visual Acuity Right Eye Distance:   Left Eye Distance:   Bilateral Distance:    Right Eye Near:   Left Eye Near:    Bilateral Near:     Physical Exam Vitals reviewed.  Constitutional:      Appearance: Normal appearance.  HENT:     Mouth/Throat:     Pharynx: Posterior oropharyngeal erythema present. No oropharyngeal exudate.  Cardiovascular:     Rate and Rhythm: Normal rate and regular rhythm.  Pulmonary:     Effort: Pulmonary effort is normal.     Breath sounds: Wheezing present.  Skin:    General: Skin is warm and dry.   Neurological:     General: No focal deficit present.     Mental Status: He is alert and oriented to person, place, and time.  Psychiatric:        Mood and Affect: Mood normal.  Behavior: Behavior normal.      UC Treatments / Results  Labs (all labs ordered are listed, but only abnormal results are displayed) Labs Reviewed - No data to display  EKG   Radiology No results found.  Procedures Procedures (including critical care time)  Medications Ordered in UC Medications - No data to display  Initial Impression / Assessment and Plan / UC Course  I have reviewed the triage vital signs and the nursing notes.  Pertinent labs & imaging results that were available during my care of the patient were reviewed by me and considered in my medical decision making (see chart for details).   Patient is afebrile here without recent antipyretics. Satting well on room air. Overall is well appearing, well hydrated, without respiratory distress. Pulmonary exam is remarkable for inspiratory wheezes in all lobes.  Mild oropharyngeal erythema without peritonsillar exudates.  Patient's symptoms are consistent with an acute viral sinusitis resulting in cough due to postnasal drip.  Some concern for reactive airway given his wheezing.  Due to his PMH including heart failure and respiratory failure, he is at risk for poor outcome.  Will treat with Augmentin, give him a course of prednisone, and some Astelin to help control his nasal discharge.  Final Clinical Impressions(s) / UC Diagnoses   Final diagnoses:  None   Discharge Instructions   None    ED Prescriptions   None    PDMP not reviewed this encounter.   Rose Phi,  11/08/22 1334

## 2022-11-08 NOTE — Discharge Instructions (Addendum)
Follow up here or with your primary care provider if your symptoms are worsening or not improving with treatment.          

## 2023-03-04 ENCOUNTER — Telehealth: Payer: Self-pay | Admitting: Cardiology

## 2023-03-04 NOTE — Telephone Encounter (Signed)
Call to patient. He has not done the labs as ordered. Will get those in the next few days so we can see if he needs to continue the statin.

## 2023-03-04 NOTE — Telephone Encounter (Signed)
Pt c/o medication issue:  1. Name of Medication:  rosuvastatin (CRESTOR) 20 MG tablet   2. How are you currently taking this medication (dosage and times per day)?   3. Are you having a reaction (difficulty breathing--STAT)?   4. What is your medication issue?   Felicia with [employer] called on behalf of the patient. She states patient would like to clarify if he needs to continue taking Rosuvastatin. If so, he will need a refill sent to the [facility] on file. Please return call to patient with advisement and if questions call Felicia at [phone] (ext#: [phone]).

## 2023-03-24 ENCOUNTER — Other Ambulatory Visit
Admission: RE | Admit: 2023-03-24 | Discharge: 2023-03-24 | Disposition: A | Discharge status: Home / Self Care | Payer: Medicare Other | Attending: Cardiology | Admitting: Cardiology

## 2023-03-24 DIAGNOSIS — E119 Type 2 diabetes mellitus without complications: Secondary | ICD-10-CM | POA: Insufficient documentation

## 2023-03-24 DIAGNOSIS — I10 Essential (primary) hypertension: Secondary | ICD-10-CM | POA: Diagnosis present

## 2023-03-24 DIAGNOSIS — E7800 Pure hypercholesterolemia, unspecified: Secondary | ICD-10-CM | POA: Diagnosis not present

## 2023-03-24 LAB — COMPREHENSIVE METABOLIC PANEL
ALT: 14 U/L (ref 0–44)
AST: 17 U/L (ref 15–41)
Albumin: 4 g/dL (ref 3.5–5.0)
Alkaline Phosphatase: 79 U/L (ref 38–126)
Anion gap: 9 (ref 5–15)
BUN: 15 mg/dL (ref 8–23)
CO2: 26 mmol/L (ref 22–32)
Calcium: 8.8 mg/dL — ABNORMAL LOW (ref 8.9–10.3)
Chloride: 100 mmol/L (ref 98–111)
Creatinine, Ser: 1.02 mg/dL (ref 0.61–1.24)
GFR, Estimated: >60 mL/min (ref >60)
Glucose, Bld: 157 mg/dL — ABNORMAL HIGH (ref 70–99)
Potassium: 4.1 mmol/L (ref 3.5–5.1)
Sodium: 135 mmol/L (ref 135–145)
Total Bilirubin: 1 mg/dL (ref 0.3–1.2)
Total Protein: 7.1 g/dL (ref 6.5–8.1)

## 2023-03-24 LAB — LIPID PANEL
Cholesterol: 194 mg/dL (ref 0–200)
HDL: 47 mg/dL (ref >40)
LDL Cholesterol: 124 mg/dL — ABNORMAL HIGH (ref 0–99)
Total CHOL/HDL Ratio: 4.1 RATIO
Triglycerides: 113 mg/dL (ref <150)
VLDL: 23 mg/dL (ref 0–40)

## 2023-03-24 LAB — HEMOGLOBIN A1C
Hgb A1c MFr Bld: 7.6 % — ABNORMAL HIGH (ref 4.8–5.6)
Mean Plasma Glucose: 171.42 mg/dL

## 2023-03-25 ENCOUNTER — Telehealth: Payer: Self-pay | Admitting: *Deleted

## 2023-03-25 DIAGNOSIS — E7800 Pure hypercholesterolemia, unspecified: Secondary | ICD-10-CM

## 2023-03-25 MED ORDER — ROSUVASTATIN CALCIUM 20 MG PO TABS: 20.0000 mg | ORAL_TABLET | Freq: Every day | ORAL | 3 refills | Status: DC

## 2023-03-25 NOTE — Telephone Encounter (Signed)
Spoke with pt, aware paperwork for 3 months will be mailed to him.

## 2023-03-25 NOTE — Telephone Encounter (Signed)
Spoke with pt, he was given the number to [facility] so they can help him find a medical doctor. He reports he was not taking rosuvastatin at the time of the lab draw and is willing to restart. New script sent to the pharmacy, will find out from dr [place] when he would like to have labs repeated.

## 2023-03-25 NOTE — Telephone Encounter (Signed)
-----   Message from Peter [place] sent at 03/25/2023  7:52 AM EDT ----- The following abnormalities are noted:  sugar elevated A1c 7.6%. LDL 124 is not at goal < 70 given history of prior stroke All other values are normal, stable or within acceptable limits. Medication changes / Follow up labs / Other changes or recommendations:   Again needs primary care to address diabetic control. I am unsure what statin and what dose he is taking. Some notes say Crestor others lipitor. Please check  Peter [place], MD 03/25/2023 7:51 AM

## 2023-03-30 ENCOUNTER — Other Ambulatory Visit: Payer: Self-pay

## 2023-03-30 DIAGNOSIS — E7800 Pure hypercholesterolemia, unspecified: Secondary | ICD-10-CM

## 2023-03-30 MED ORDER — ROSUVASTATIN CALCIUM 20 MG PO TABS
20.0000 mg | ORAL_TABLET | Freq: Every day | ORAL | 3 refills | Status: AC
  Filled 2023-03-30: qty 30, 30d supply, fill #0

## 2023-03-30 MED ORDER — ROSUVASTATIN CALCIUM 20 MG PO TABS
20.0000 mg | ORAL_TABLET | Freq: Every day | ORAL | 3 refills | Status: DC
  Filled 2023-03-30: qty 30, 30d supply, fill #0

## 2023-10-02 ENCOUNTER — Encounter: Payer: Self-pay | Admitting: Family Medicine

## 2023-10-02 ENCOUNTER — Ambulatory Visit: Payer: Medicare Other

## 2023-10-02 ENCOUNTER — Ambulatory Visit: Payer: Medicare Other | Admitting: Family Medicine

## 2023-10-02 DIAGNOSIS — Z113 Encounter for screening for infections with a predominantly sexual mode of transmission: Secondary | ICD-10-CM

## 2023-10-02 LAB — HEPATITIS B SURFACE ANTIGEN

## 2023-10-02 LAB — HM HIV SCREENING LAB: HM HIV Screening: NEGATIVE

## 2023-10-02 LAB — HM HEPATITIS C SCREENING LAB: HM Hepatitis Screen: NEGATIVE

## 2023-10-02 NOTE — Progress Notes (Signed)
Pt is here for STD screening.  Condoms declined.  Berdie Ogren, RN

## 2023-10-02 NOTE — Addendum Note (Signed)
Addended by: Lenice Llamas on: 10/02/2023 09:44 AM   Modules accepted: Orders

## 2023-10-02 NOTE — Progress Notes (Signed)
[facility] Department STI clinic [address], [address] [place] [address] Main phone: [phone]  STI screening visit  Subjective:  Steven Brandt is a 68 y.o. male being seen today for an STI screening visit. The patient reports they do not have symptoms.   Patient has the following medical conditions:  Patient Active Problem List   Diagnosis Date Noted   Stroke-like symptom 07/09/2022   Chronic systolic CHF (congestive heart failure) (HCC) 07/09/2022   HLD (hyperlipidemia) 07/09/2022   Dizziness 12/04/2021   Chest pain 12/03/2021   Post-traumatic stress disorder, unspecified 12/03/2021   Attention deficit hyperactivity disorder 12/03/2021   Cardiomyopathy (HCC) 12/03/2021   Cervical spondylosis without myelopathy 12/03/2021   Chronic back pain 12/03/2021   Controlled type 2 diabetes mellitus without complication, without long-term current use of insulin (HCC) 12/03/2021   Depressive disorder 12/03/2021   Essential hypertension 12/03/2021   Gastroesophageal reflux disease 12/03/2021   Alcoholism (HCC) 12/03/2021   Hearing loss 12/03/2021   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 Pt states his wife told him she has "viral hepatitis" and herpes. Pt states that they "have never consummated the marriage", and the last time he had contact with her was 2 years ago, when he performed oral sex on her. Denies rashes, sores, lesions etc. I counseled patient that we cannot do blood testing today for herpes, as this is not a test offered by the health department. I explained that testing for herpes is typically done when someone has a lesion- it can then be unroofed and the fluid is collected. Pt expressed  dissatisfaction that we cannot do a blood test here.   Last HIV test per patient/review of record was No results found for: "[facility]HIVSCREEN"  Lab Results  Component Value Date   HIV Non Reactive 12/03/2021    Last HEPC test per patient/review of record was No results found for: "HMHEPCSCREEN" No components found for: "HEPC"   Last HEPB test per patient/review of record was No components found for: "HMHEPBSCREEN" No components found for: "HEPC"   Does the patient or their partner desires a pregnancy in the next year? No  Screening for MPX risk: Does the patient have an unexplained rash? No Is the patient MSM? No Does the patient endorse multiple sex partners or anonymous sex partners? No Did the patient have close or sexual contact with a person diagnosed with MPX? No Has the patient traveled outside the [place] where MPX is endemic? No Is there a high clinical suspicion for MPX-- evidenced by one of the following No  -Unlikely to be chickenpox  -Lymphadenopathy  -Rash that present in same phase of evolution on any given body part   See flowsheet for further details and programmatic requirements.    There is no immunization history on file for this patient.   The following portions of the patient's history were reviewed and updated as appropriate: allergies, current medications, past medical history, past social history, past surgical history and problem list.  Objective:  There were no vitals filed for this visit.  Physical Exam Exam conducted with a chaperone present Steven Brandt).  Constitutional:      Appearance: Normal appearance.  HENT:  Head: Normocephalic and atraumatic.     Comments: No nits or hair loss    Mouth/Throat:     Mouth: Mucous membranes are moist. No oral lesions.     Pharynx: Oropharynx is clear. No oropharyngeal exudate or posterior oropharyngeal erythema.  Eyes:     General:        Right eye: No discharge.        Left eye: No discharge.      Conjunctiva/sclera:     Right eye: Right conjunctiva is not injected. No exudate.    Left eye: Left conjunctiva is not injected. No exudate. Pulmonary:     Effort: Pulmonary effort is normal.  Abdominal:     General: Abdomen is flat.     Palpations: Abdomen is soft. There is no hepatomegaly or mass.     Tenderness: There is no abdominal tenderness. There is no rebound.     Hernia: There is no hernia in the left inguinal area or right inguinal area.  Genitourinary:    Pubic Area: No rash or pubic lice (no nits).      Penis: Normal. No tenderness, discharge, swelling or lesions.      Testes: Normal.     Epididymis:     Right: Normal. No mass or tenderness.     Left: Normal. No mass or tenderness.     Rectum: Normal. No tenderness (no lesions or discharge).     Comments: Penile Discharge Amount: none Color:  none Lymphadenopathy:     Head:     Right side of head: No preauricular or posterior auricular adenopathy.     Left side of head: No preauricular or posterior auricular adenopathy.     Cervical: No cervical adenopathy.     Upper Body:     Right upper body: No supraclavicular, axillary or epitrochlear adenopathy.     Left upper body: No supraclavicular, axillary or epitrochlear adenopathy.     Lower Body: No right inguinal adenopathy. No left inguinal adenopathy.  Skin:    General: Skin is warm and dry.     Findings: No lesion or rash.  Neurological:     Mental Status: He is alert and oriented to person, place, and time.     Assessment and Plan:  Steven Brandt is a 68 y.o. male presenting to the [facility] Department for STI screening  1. Screening for venereal disease (Primary) Pt reports his partner has "viral hepatitis"- strongly desires testing today for HepB/C  - HIV/HCV [facility] - Syphilis Serology, [facility] - HBV Antigen/Antibody [facility] - Chlamydia/GC NAA, Confirmation   Patient does not have STI symptoms Patient accepted all  screenings including  urine GC/Chlamydia, and blood work for HIV/Syphilis. Patient meets criteria for HepB screening? No. Ordered? yes Patient meets criteria for HepC screening? No. Ordered? yes Recommended condom use with all sex Discussed importance of condom use for STI prevention  Treat positive test results per standing order. Discussed time line for [facility] results and that patient will be called with positive results and encouraged patient to call if he had not heard in 2 weeks Recommended repeat testing in 3 months with positive results. Recommended returning for continued or worsening symptoms.   Return if symptoms worsen or fail to improve, for STI screening.  No future appointments.  Lenice Llamas, [place]

## 2023-10-04 LAB — HSV 1 AND 2 AB, IGG
HSV 1 Glycoprotein G Ab, IgG: REACTIVE — AB
HSV 2 IgG, Type Spec: NONREACTIVE

## 2023-10-05 ENCOUNTER — Telehealth: Payer: Self-pay

## 2023-10-05 LAB — CHLAMYDIA/GC NAA, CONFIRMATION
Chlamydia trachomatis, NAA: NEGATIVE
Neisseria gonorrhoeae, NAA: NEGATIVE

## 2023-10-06 NOTE — Telephone Encounter (Signed)
Password verified Informed of HSV results.  All questions answered.

## 2024-01-12 IMAGING — CT CT ANGIO CHEST
2 of 7 series · 18 of 46 positions shown · IV contrast (APPLIED)
Comparison: Radiograph of same day.

CLINICAL DATA: Chest pain.

EXAM:
CT ANGIOGRAPHY CHEST WITH CONTRAST
TECHNIQUE: Multidetector CT imaging of the chest was performed using the
standard protocol during bolus administration of intravenous
contrast. Multiplanar CT image reconstructions and MIPs were
obtained to evaluate the vascular anatomy.

[Series 6: thins · axial · 0.77mm/px · z∈[-674,-422]mm · 15 of 407 slices shown]
[im 23/407  lung]
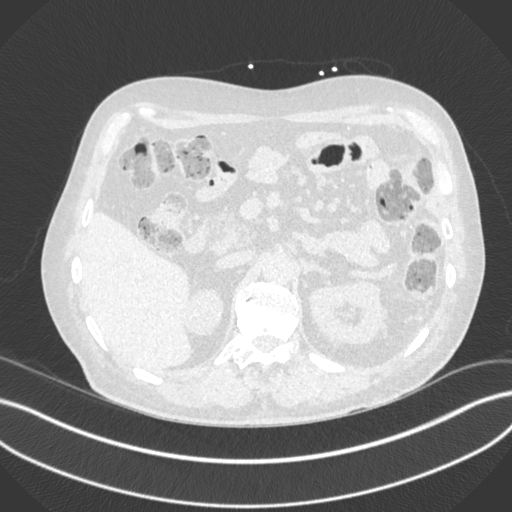
[im 46/407  soft-tissue]
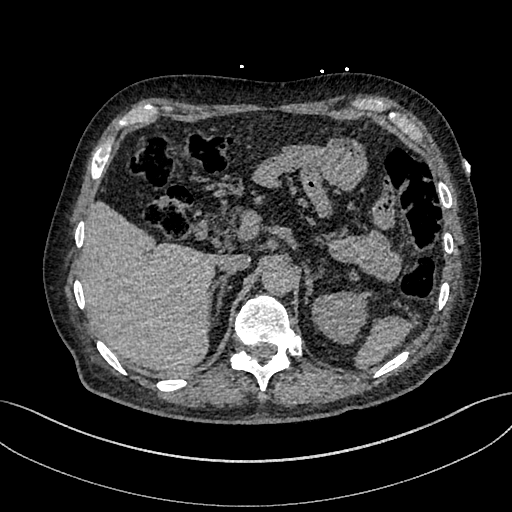
[im 68/407  lung]
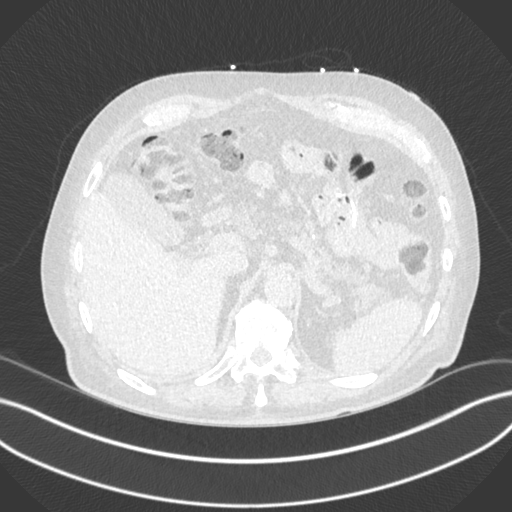
[im 91/407  soft-tissue]
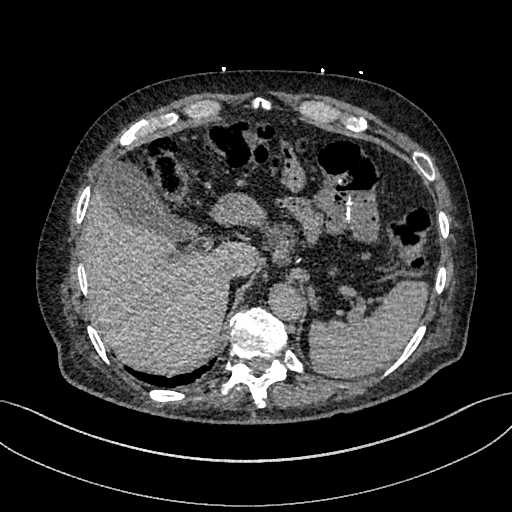
[im 136/407  lung]
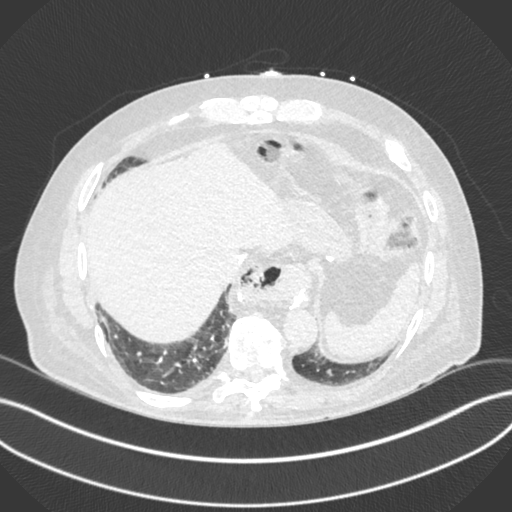
[im 158/407  soft-tissue]
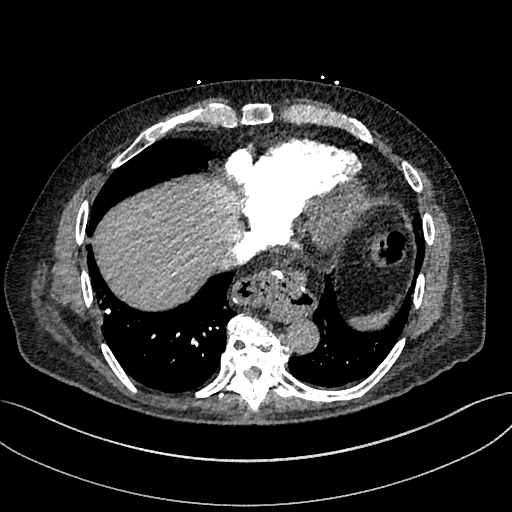
[im 181/407  lung]
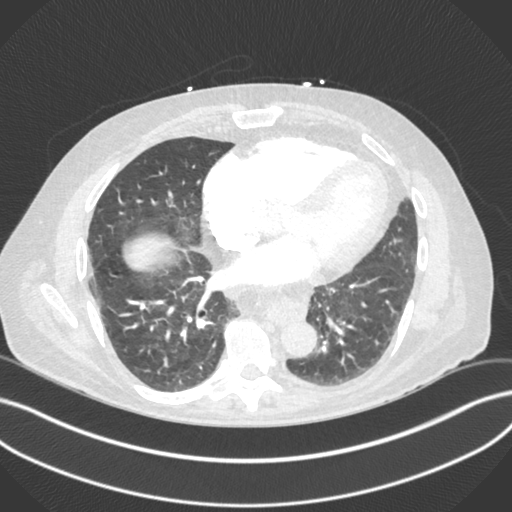
[im 204/407  soft-tissue]
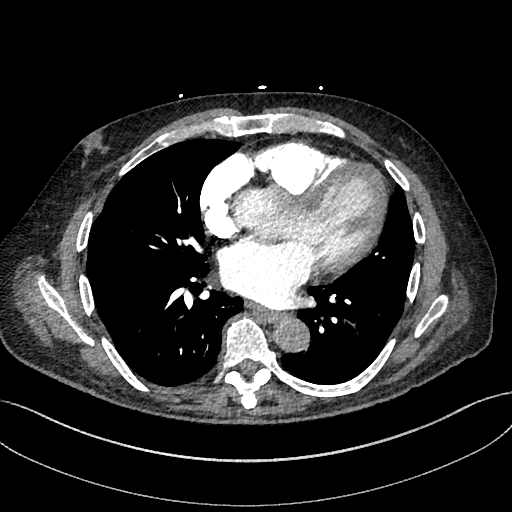
[im 226/407  lung]
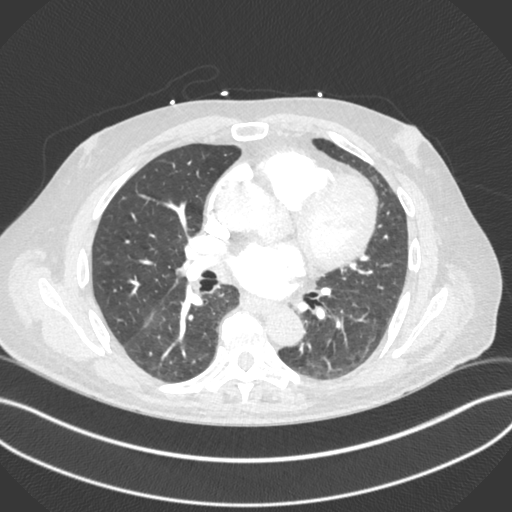
[im 249/407  soft-tissue]
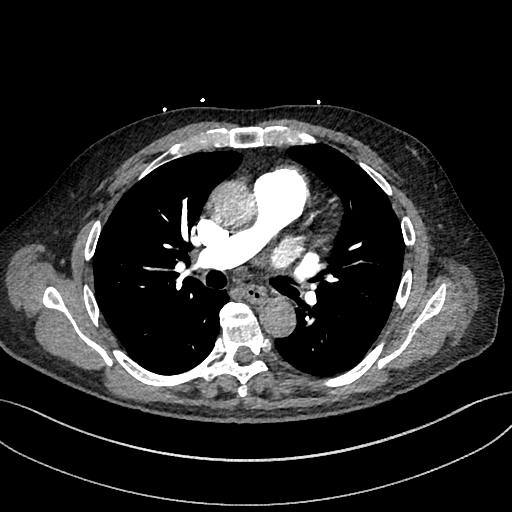
[im 271/407  lung]
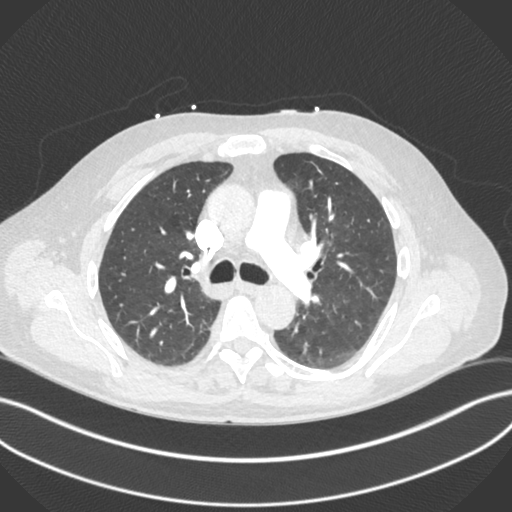
[im 316/407  soft-tissue]
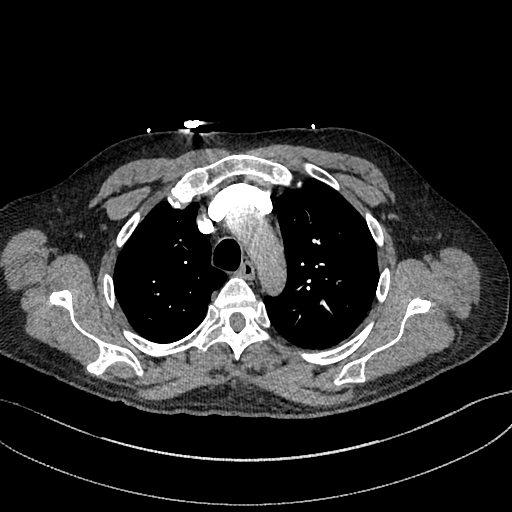
[im 339/407  lung]
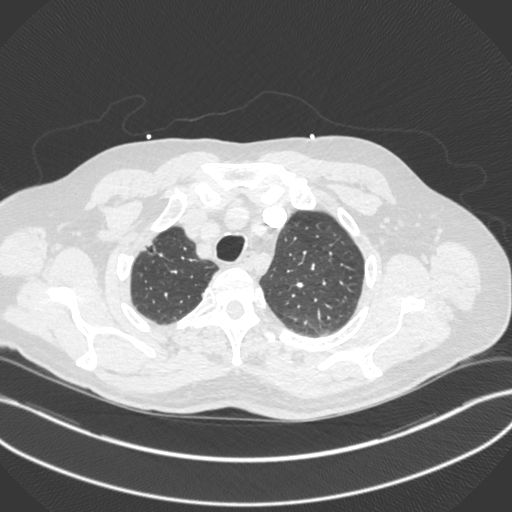
[im 361/407  soft-tissue]
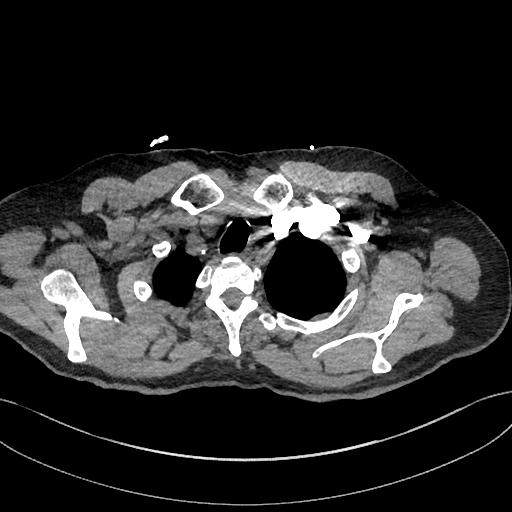
[im 384/407  lung]
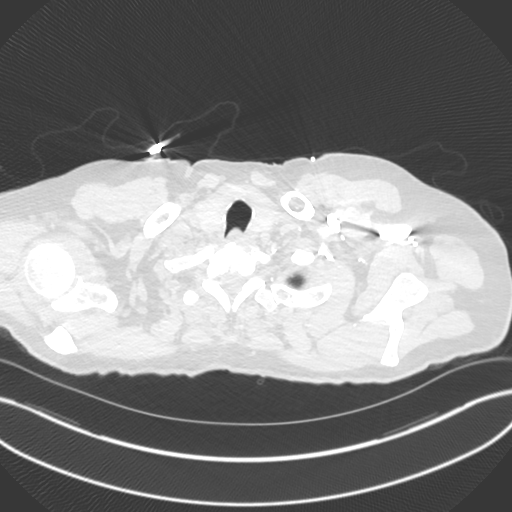

[Series 7: cor · coronal · 0.62mm/px · 3 of 133 slices shown]
[im 34/133  soft-tissue]
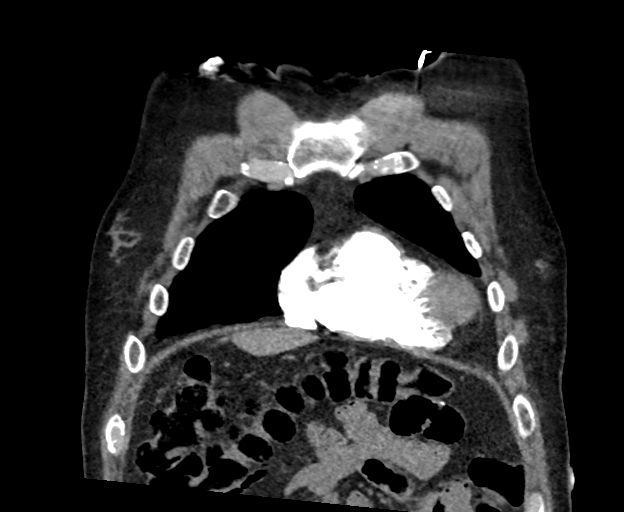
[im 67/133  soft-tissue]
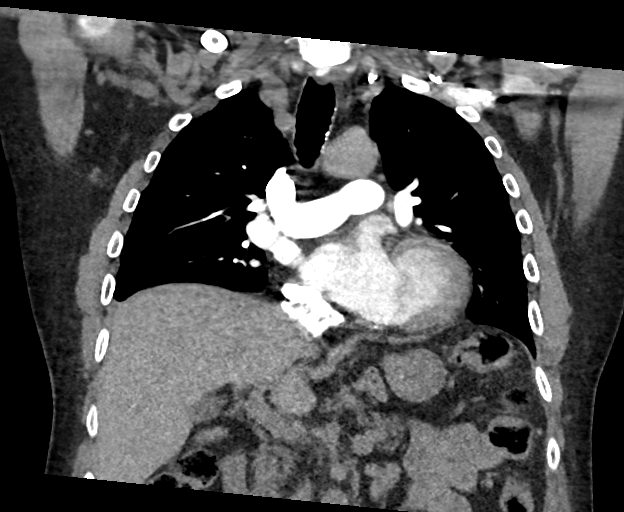
[im 100/133  soft-tissue]
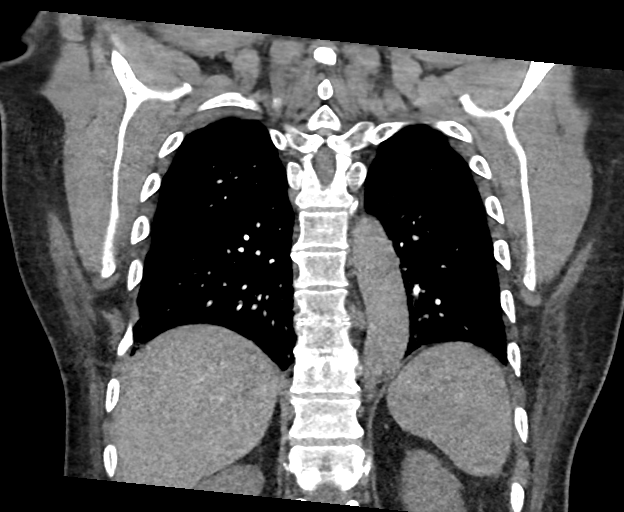

[18 of 46 positions shown; findings below may reference images not displayed]

RADIATION DOSE REDUCTION: This exam was performed according to the
departmental dose-optimization program which includes automated
exposure control, adjustment of the mA and/or kV according to
patient size and/or use of iterative reconstruction technique.

CONTRAST:  75mL OMNIPAQUE IOHEXOL 350 MG/ML SOLN
FINDINGS: Cardiovascular: Satisfactory opacification of the pulmonary arteries
to the segmental level. No evidence of pulmonary embolism. Normal
heart size. No pericardial effusion.

Mediastinum/Nodes: Thyroid gland is unremarkable. No adenopathy is
noted. Moderate size sliding-type hiatal hernia is noted with
postsurgical changes present.

Lungs/Pleura: No pneumothorax or pleural effusion is noted. Small
focus of scarring seen in right lung apex as well as laterally in
the right lung base. No acute pulmonary disease is noted.

Upper Abdomen: No acute abnormality.

Musculoskeletal: No chest wall abnormality. No acute or significant
osseous findings.

Review of the MIP images confirms the above findings.
IMPRESSION: No definite evidence of pulmonary embolus.

Moderate size sliding-type hiatal hernia is noted with postsurgical
changes.

## 2024-01-12 IMAGING — CR DG CHEST 2V
2 series · 2 of 2 positions shown · non-contrast
Comparison: None.

CLINICAL DATA: Chest pain

EXAM:
CHEST - 2 VIEW

[chest pa]
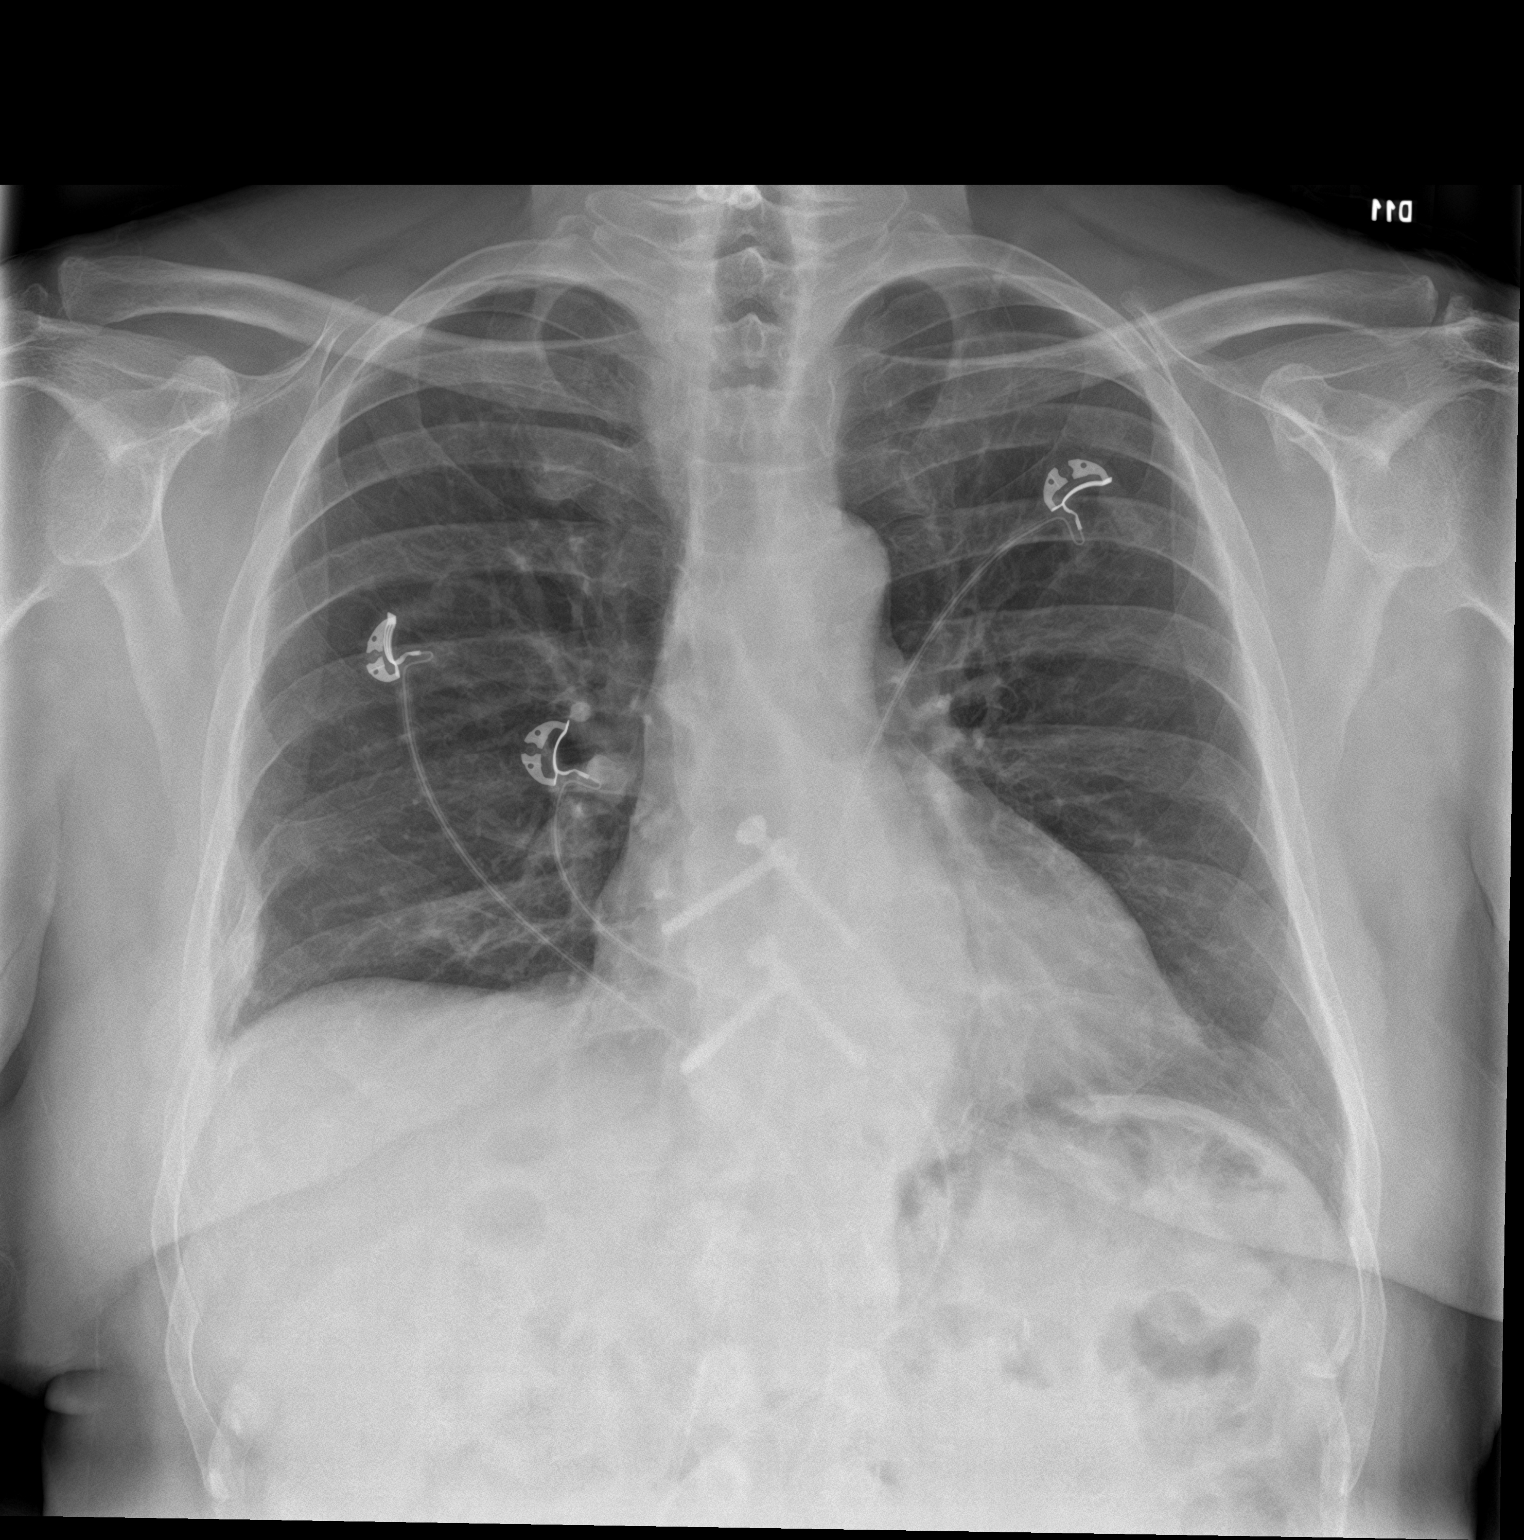

[chest lat]
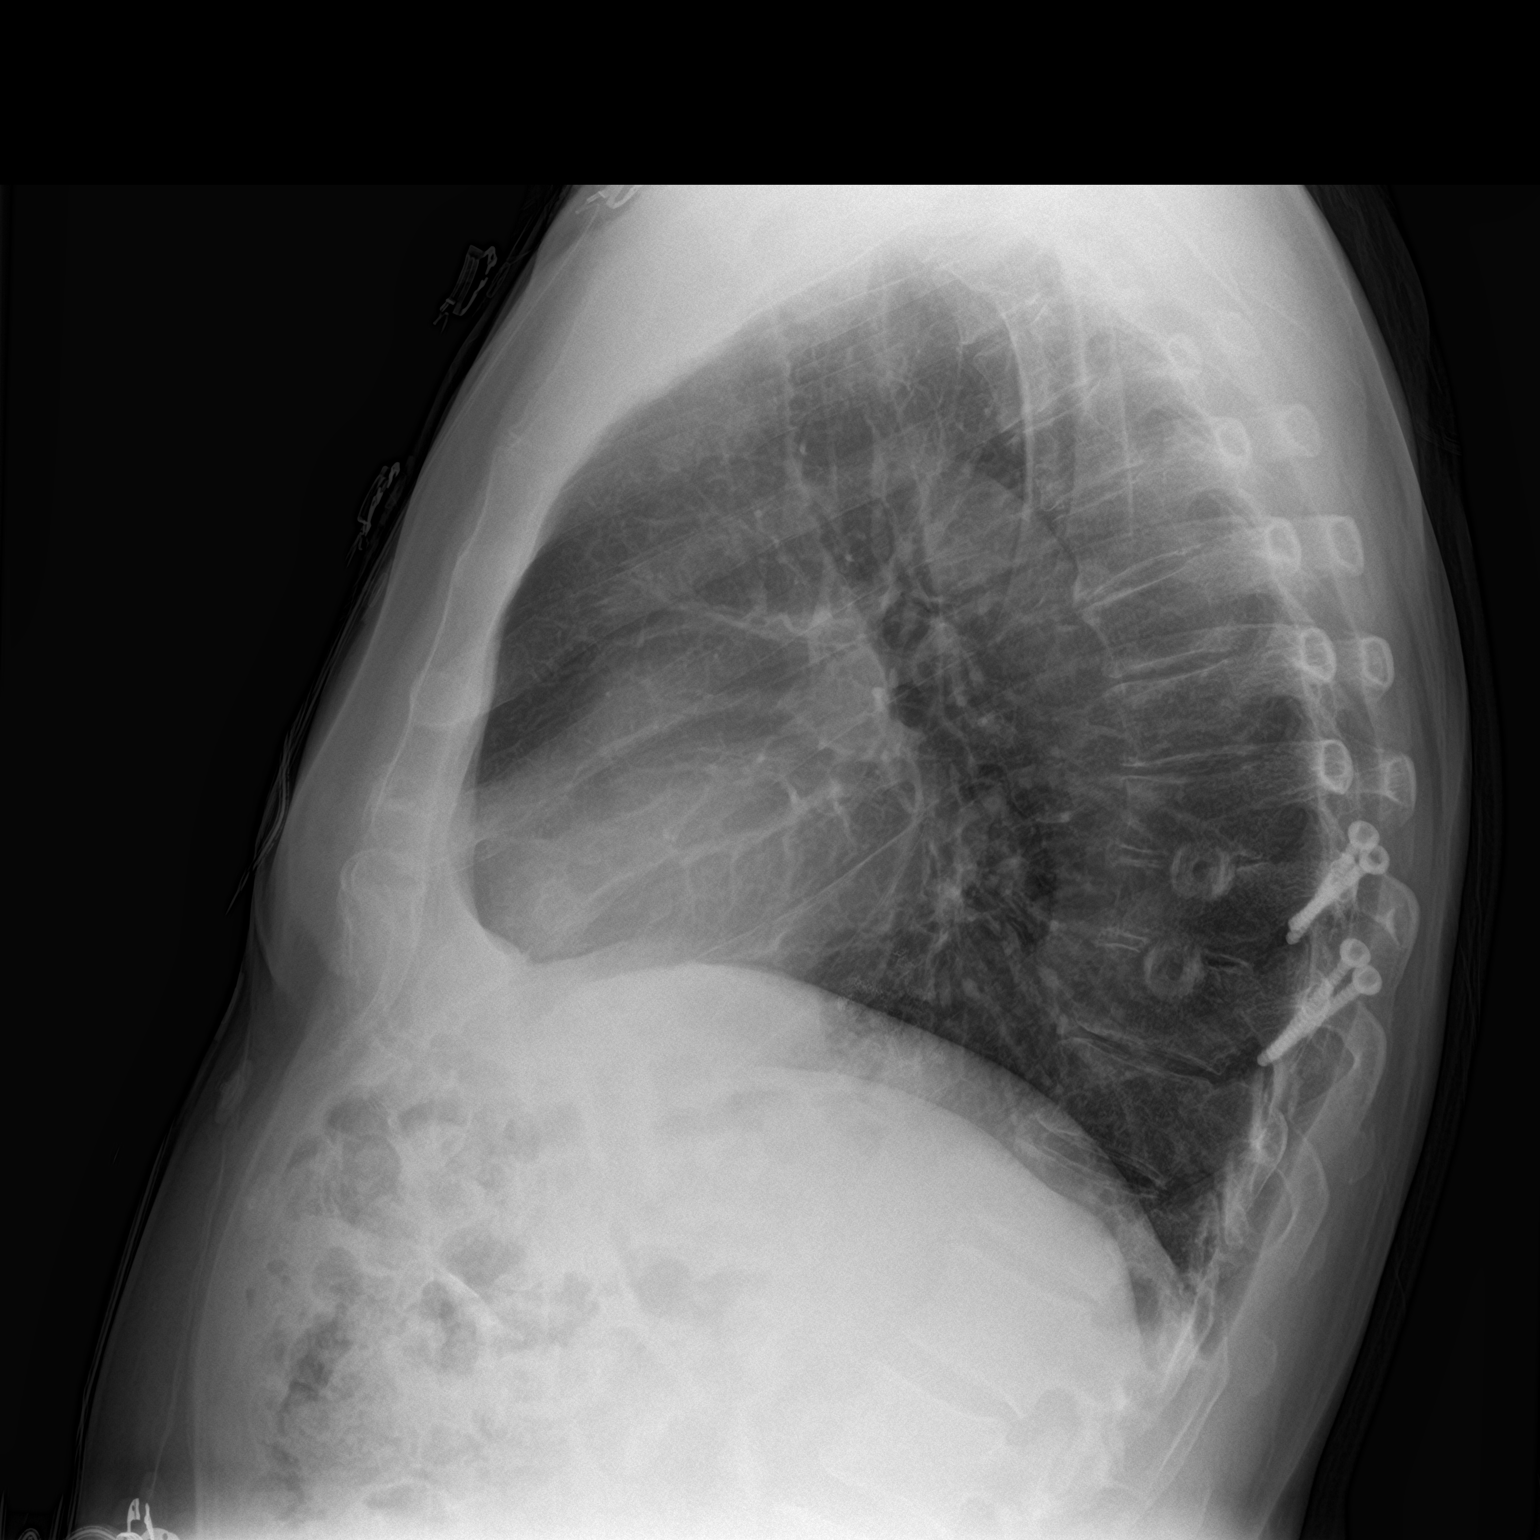

[2 of 2 positions shown; findings below may reference images not displayed]

FINDINGS: Normal heart size. Mild tortuosity of thoracic aorta, likely age
related. Focal pleural thickening at the right costophrenic angle.
Mild elevation of the right hemidiaphragm. Lungs are clear. No
pleural effusion or pneumothorax.
IMPRESSION: Focal pleural thickening of the right costophrenic angle. Recommend
comparison with outside priors if available to confirm stability. If
no priors are available, recommend chest CT with contrast for
further evaluation.
